# Patient Record
Sex: Female | Born: 1995 | Race: White | Hispanic: No | Marital: Single
Health system: Midwestern US, Academic
[De-identification: ages and names within clinical notes are randomized; demographics above are authoritative.]

---

## 2017-09-17 IMAGING — US PELCM
1 series · 14 of 25 positions shown · non-contrast
Comparison: none

[Series 1: us pelvic complete · 14 of 48 slices shown]
[im 1/48]
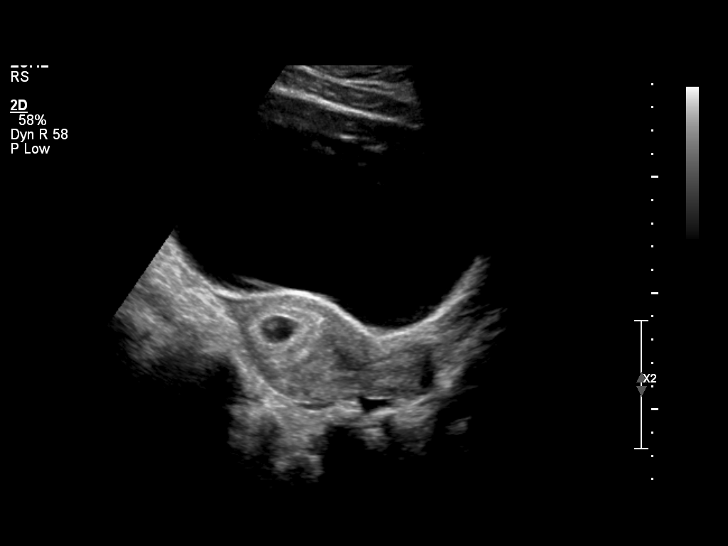
[im 4/48]
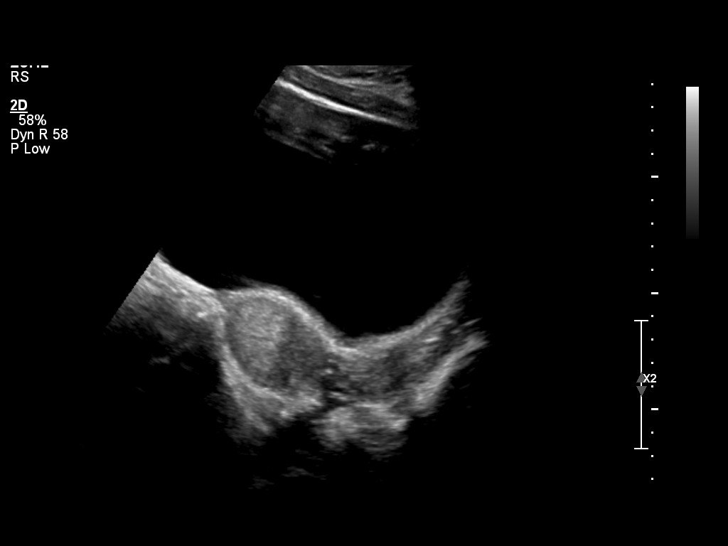
[im 8/48]
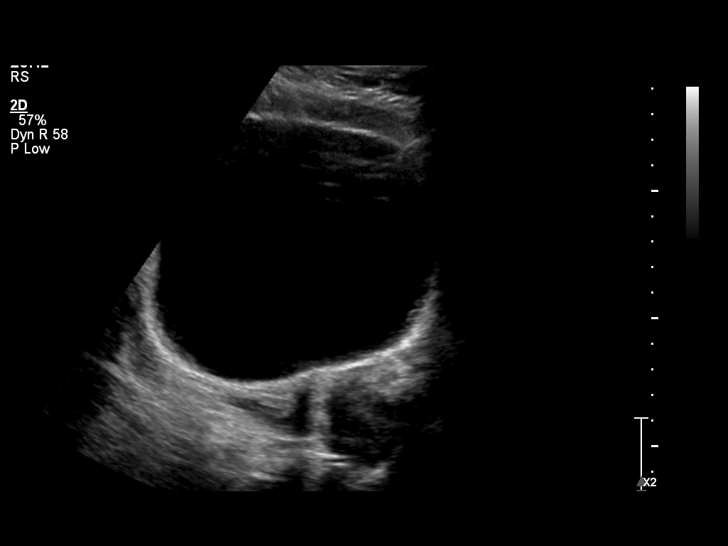
[im 12/48]
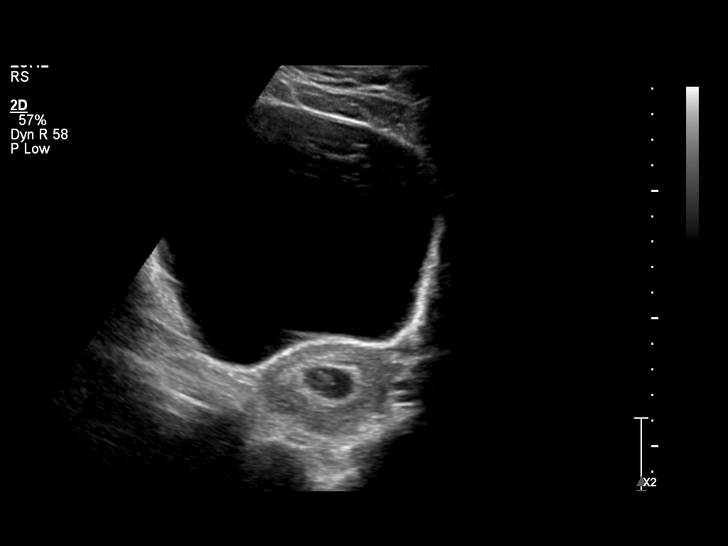
[im 16/48]
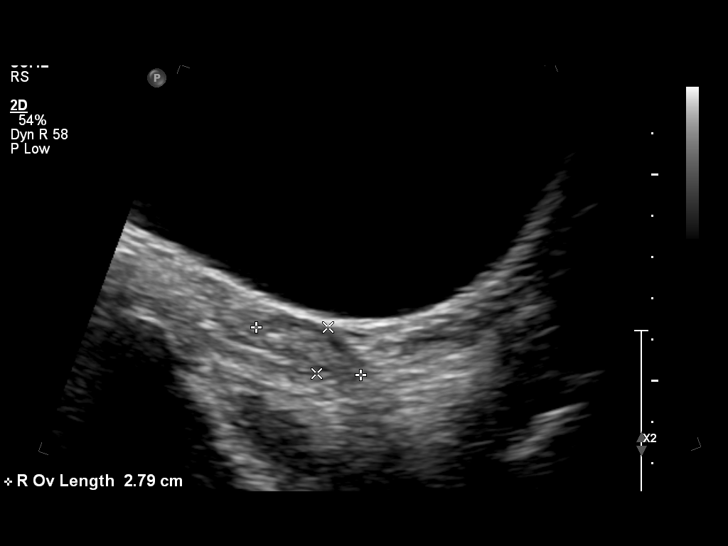
[im 18/48]
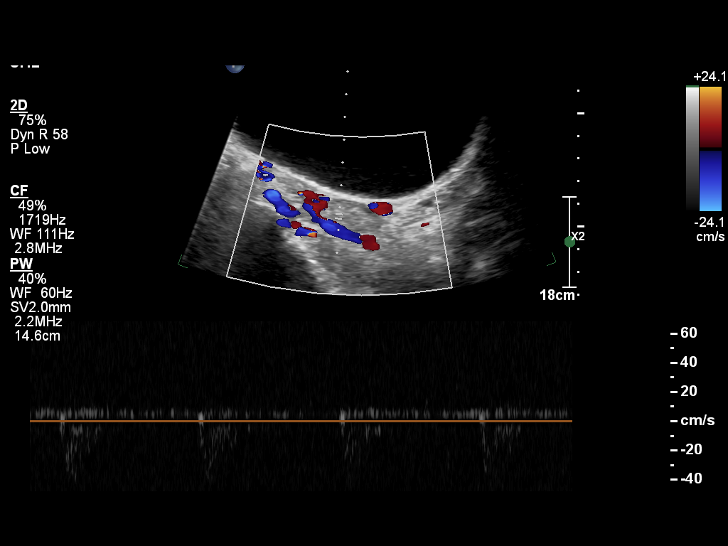
[im 22/48]
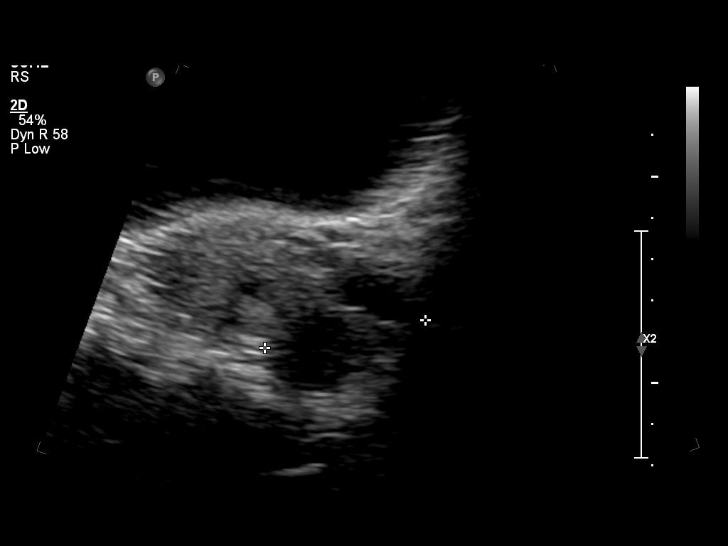
[im 26/48]
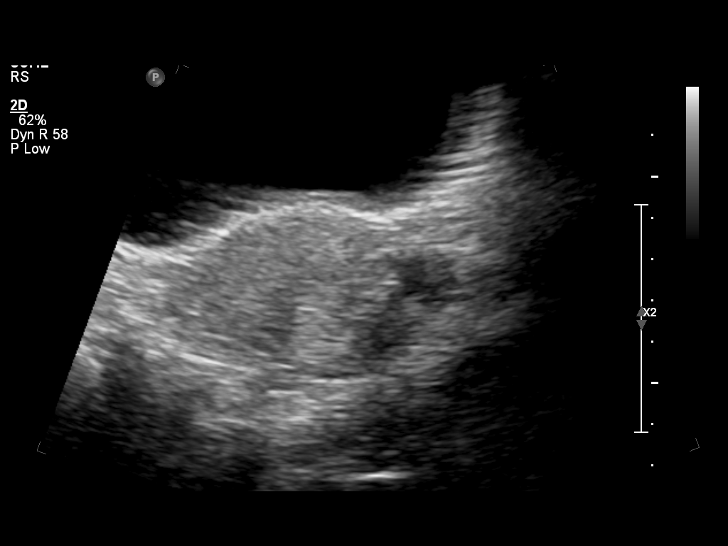
[im 30/48]
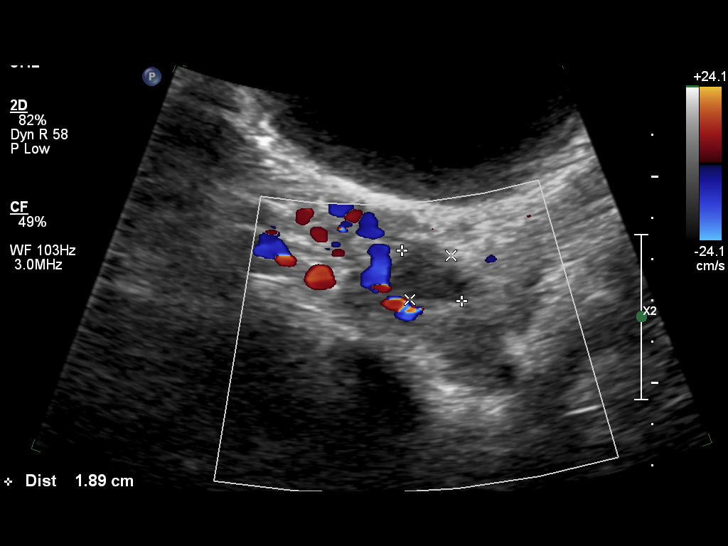
[im 32/48]
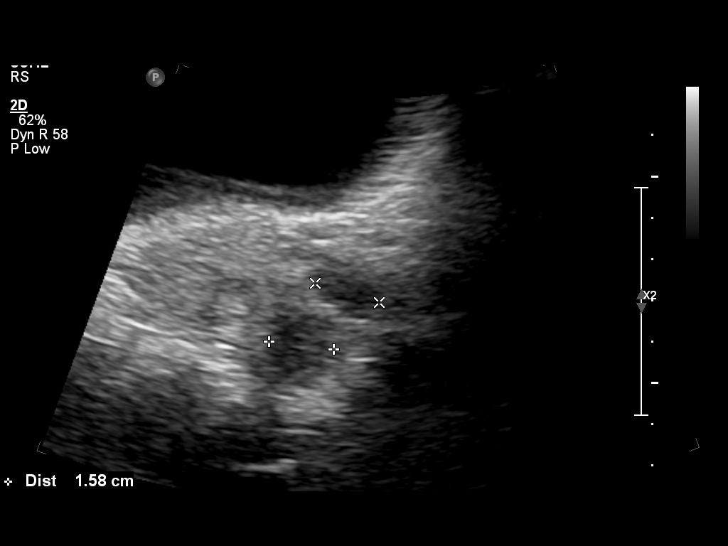
[im 36/48]
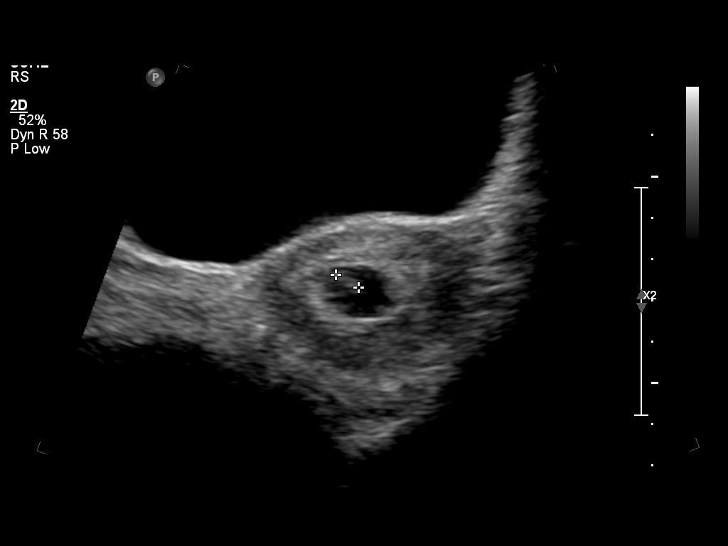
[im 40/48]
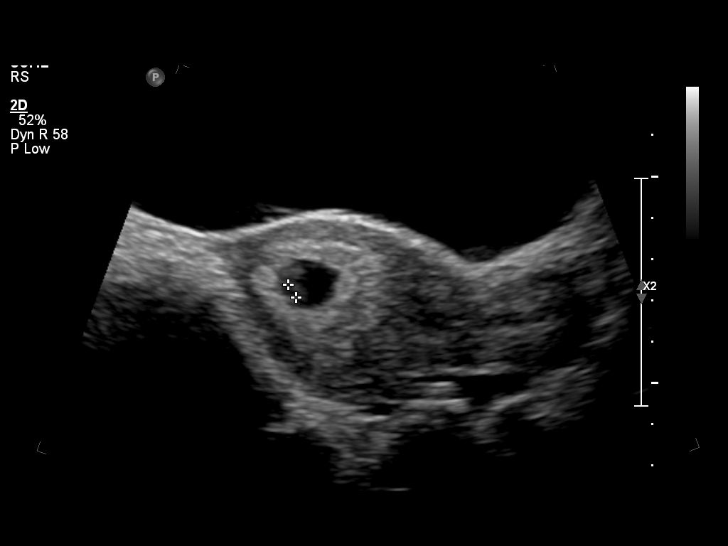
[im 44/48]
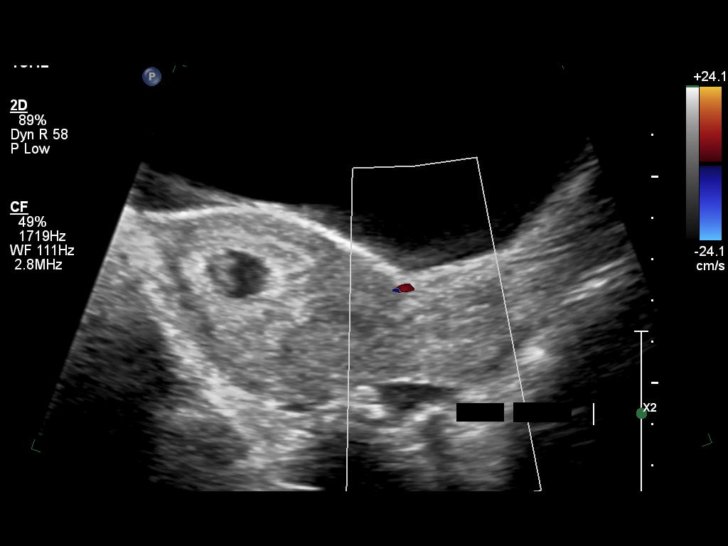
[im 48/48]
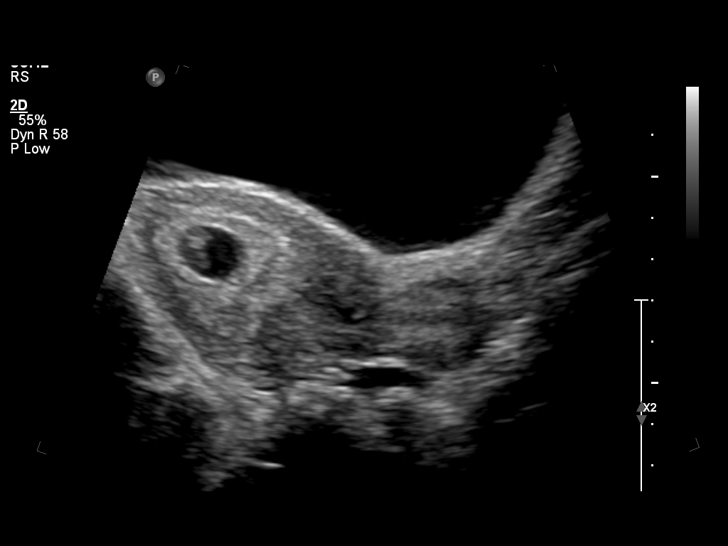

[14 of 25 positions shown; findings below may reference images not displayed]

EXAM
First trimester OB ultrasound.

INDICATION
BLEEDING

TECHNIQUE
Transabdominal technique only.

COMPARISONS
No prior studies are available for comparison.

FINDINGS
There is a single live intrauterine pregnancy. Fetal pole is identified with crown-rump length
measuring 5.4 millimeters correlating with gestational age is 6 weeks 3 days. There is no subchorion
ic hemorrhage identified. Fetal heart motion is documented at 128 beats per minute. A yolk sac is
identified.
The maternal ovaries are unremarkable. Hypoechoic areas in the left ovary measure 1.9 x 1.5 x
centimeters and 1.5 x 1.3 x 1.6 centimeters. Normal blood flow in both ovaries. Small amount of
posterior cul-de-sac fluid is incidentally noted.

IMPRESSION
Single live intrauterine pregnancy with estimated gestational age by ultrasound of 6 weeks 3 days
correlating with sonographic EDD of 05/10/2018.

Tech Notes:

## 2017-10-10 ENCOUNTER — Encounter: Admit: 2017-10-10 | Discharge: 2017-10-10

## 2017-10-10 ENCOUNTER — Inpatient Hospital Stay
Admit: 2017-10-10 | Discharge: 2017-10-17 | Disposition: A | Discharge status: Home / Self Care | Payer: 59 | Attending: Maternal & Fetal Medicine | Admitting: Maternal & Fetal Medicine

## 2017-10-10 DIAGNOSIS — E079 Disorder of thyroid, unspecified: ICD-10-CM

## 2017-10-10 DIAGNOSIS — E119 Type 2 diabetes mellitus without complications: Principal | ICD-10-CM

## 2017-10-10 LAB — POC GLUCOSE: Lab: 361 mg/dL — ABNORMAL HIGH (ref 70–100)

## 2017-10-10 MED ORDER — ONDANSETRON HCL (PF) 4 MG/2 ML IJ SOLN
4 mg | INTRAVENOUS | 0 refills | Status: DC | PRN
Start: 2017-10-10 — End: 2017-10-17

## 2017-10-10 MED ORDER — SODIUM CHLORIDE 0.9 % IV SOLP
1000 mL | INTRAVENOUS | 0 refills | Status: AC
Start: 2017-10-10 — End: ?

## 2017-10-10 MED ORDER — LEVOTHYROXINE 50 MCG PO TAB
50 ug | Freq: Every day | ORAL | 0 refills | Status: DC
Start: 2017-10-10 — End: 2017-10-17
  Administered 2017-10-11 – 2017-10-16 (×6): 50 ug via ORAL

## 2017-10-10 MED ORDER — INSULIN 100UNITS NS 100ML
1-32 [IU]/h | INTRAVENOUS | 0 refills | Status: DC
Start: 2017-10-10 — End: 2017-10-12
  Administered 2017-10-11 (×2): 1 [IU]/h via INTRAVENOUS

## 2017-10-10 MED ORDER — PRENATAL VIT-IRON FUM-FOLIC AC 28 MG IRON- 800 MCG PO TAB
1 | Freq: Every day | ORAL | 0 refills | Status: DC
Start: 2017-10-10 — End: 2017-10-17
  Administered 2017-10-11 – 2017-10-16 (×6): 1 via ORAL

## 2017-10-10 MED ORDER — NITROFURANTOIN MONOHYD/M-CRYST 100 MG PO CAP
100 mg | Freq: Two times a day (BID) | ORAL | 0 refills | Status: DC
Start: 2017-10-10 — End: 2017-10-17
  Administered 2017-10-11 – 2017-10-17 (×13): 100 mg via ORAL

## 2017-10-10 MED ORDER — INSULIN REGULAR HUMAN(#) 1 UNIT/ML IJ SYRINGE
1-20 [IU] | INTRAVENOUS | 0 refills | Status: DC
Start: 2017-10-10 — End: 2017-10-14
  Administered 2017-10-11: 3 [IU] via INTRAVENOUS

## 2017-10-11 LAB — POC GLUCOSE
Lab: 254 mg/dL — ABNORMAL HIGH (ref 70–100)
Lab: 114 mg/dL — ABNORMAL HIGH (ref 70–100)
Lab: 142 mg/dL — ABNORMAL HIGH (ref 70–100)
Lab: 154 mg/dL — ABNORMAL HIGH (ref 70–100)
Lab: 114 mg/dL — ABNORMAL HIGH (ref 70–100)
Lab: 103 mg/dL — ABNORMAL HIGH (ref 70–100)
Lab: 79 mg/dL (ref 70–100)
Lab: 118 mg/dL — ABNORMAL HIGH (ref 70–100)
Lab: 133 mg/dL — ABNORMAL HIGH (ref 70–100)
Lab: 142 mg/dL — ABNORMAL HIGH (ref 70–100)
Lab: 156 mg/dL — ABNORMAL HIGH (ref 70–100)
Lab: 179 mg/dL — ABNORMAL HIGH (ref 70–100)
Lab: 156 mg/dL — ABNORMAL HIGH (ref 70–100)
Lab: 154 mg/dL — ABNORMAL HIGH (ref 70–100)
Lab: 131 mg/dL — ABNORMAL HIGH (ref 70–100)
Lab: 64 mg/dL — ABNORMAL LOW (ref 70–100)

## 2017-10-11 LAB — COMPREHENSIVE METABOLIC PANEL
Lab: 0.6 mg/dL (ref 0.4–1.00)
Lab: 131 MMOL/L — ABNORMAL LOW (ref 137–147)
Lab: 7.1 g/dL (ref 6.0–8.0)
Lab: 9.3 mg/dL (ref 8.5–10.6)

## 2017-10-11 LAB — URINALYSIS DIPSTICK
Lab: NEGATIVE mL/min
Lab: POSITIVE — AB

## 2017-10-11 LAB — BETA HYDROXYBUTYRATE (KETONES): Lab: 0.1 MMOL/L (ref <0.3)

## 2017-10-11 LAB — CBC: Lab: 16 10*3/uL — ABNORMAL HIGH (ref 4.5–11.0)

## 2017-10-11 LAB — RUBELLA AB IGG

## 2017-10-11 LAB — SYPHILIS AB SCREEN: Lab: NEGATIVE MMOL/L (ref 3.5–5.1)

## 2017-10-11 LAB — HEMOGLOBIN A1C: Lab: 10 % — ABNORMAL HIGH (ref 4.0–6.0)

## 2017-10-11 LAB — THYROID STIMULATING HORMONE-TSH: Lab: 2.1 uU/mL — ABNORMAL HIGH (ref 0.35–5.00)

## 2017-10-11 LAB — HEPATITIS B SURFACE AG: Lab: NEGATIVE mg/dL — ABNORMAL HIGH (ref 70–100)

## 2017-10-11 LAB — HIV 1& 2 AG-AB SCRN W REFLEX HIV 1 PCR QUANT: Lab: NEGATIVE mg/dL (ref 7–25)

## 2017-10-11 LAB — FREE T4 (FREE THYROXINE) ONLY: Lab: 0.8 ng/dL — AB (ref 0.6–1.6)

## 2017-10-11 LAB — URINALYSIS, MICROSCOPIC

## 2017-10-11 MED ORDER — INSULIN ASPART 100 UNIT/ML SC FLEXPEN
12 [IU] | Freq: Every day | SUBCUTANEOUS | 0 refills | Status: DC
Start: 2017-10-11 — End: 2017-10-12

## 2017-10-11 MED ORDER — INSULIN ASPART 100 UNIT/ML SC FLEXPEN
10 [IU] | Freq: Every day | SUBCUTANEOUS | 0 refills | Status: DC
Start: 2017-10-11 — End: 2017-10-12
  Administered 2017-10-12: 02:00:00 10 [IU] via SUBCUTANEOUS

## 2017-10-11 MED ORDER — NPH INSULIN HUMAN RECOMB 100 UNIT/ML (3 ML) SC PEN
21 [IU] | Freq: Every day | SUBCUTANEOUS | 0 refills | Status: DC
Start: 2017-10-11 — End: 2017-10-13
  Administered 2017-10-11: 18:00:00 21 [IU] via SUBCUTANEOUS

## 2017-10-11 MED ORDER — INSULIN ASPART 100 UNIT/ML SC FLEXPEN
8 [IU] | Freq: Every day | SUBCUTANEOUS | 0 refills | Status: DC
Start: 2017-10-11 — End: 2017-10-12

## 2017-10-11 MED ORDER — NPH INSULIN HUMAN RECOMB 100 UNIT/ML (3 ML) SC PEN
8 [IU] | Freq: Every evening | SUBCUTANEOUS | 0 refills | Status: DC
Start: 2017-10-11 — End: 2017-10-13

## 2017-10-11 MED ADMIN — SODIUM CHLORIDE 0.9 % IV SOLP [27838]: 1000 mL | INTRAVENOUS | @ 01:00:00 | Stop: 2017-10-11 | NDC 00338004904

## 2017-10-12 LAB — TOTAL PROTEIN-URINE 24 HR
Lab: 160 mg/(24.h) — ABNORMAL HIGH (ref 50–150)
Lab: 5 mg/dL

## 2017-10-12 LAB — CULTURE-URINE W/SENSITIVITY: Lab: <10 U/L — AB (ref 7–56)

## 2017-10-12 LAB — POC GLUCOSE
Lab: 74 mg/dL (ref 70–100)
Lab: 128 mg/dL — ABNORMAL HIGH (ref 70–100)
Lab: 62 mg/dL — ABNORMAL LOW (ref 70–100)
Lab: 214 mg/dL — ABNORMAL HIGH (ref 70–100)
Lab: 162 mg/dL — ABNORMAL HIGH (ref 70–100)
Lab: 197 mg/dL — ABNORMAL HIGH (ref 70–100)
Lab: 204 mg/dL — ABNORMAL HIGH (ref 70–100)
Lab: 225 mg/dL — ABNORMAL HIGH (ref 70–100)
Lab: 156 mg/dL — ABNORMAL HIGH (ref 70–100)
Lab: 184 mg/dL — ABNORMAL HIGH (ref 70–100)
Lab: 242 mg/dL — ABNORMAL HIGH (ref 70–100)
Lab: 164 mg/dL — ABNORMAL HIGH (ref 70–100)

## 2017-10-12 LAB — CREATININE CLEARANCE-URINE 24H: Lab: 48 mg/dL

## 2017-10-12 LAB — URINE COLLECTION: Lab: 24

## 2017-10-12 MED ORDER — INSULIN ASPART 100 UNIT/ML SC FLEXPEN
11 [IU] | Freq: Every day | SUBCUTANEOUS | 0 refills | Status: DC
Start: 2017-10-12 — End: 2017-10-13

## 2017-10-12 MED ORDER — INSULIN ASPART 100 UNIT/ML SC FLEXPEN
11 [IU] | Freq: Every day | SUBCUTANEOUS | 0 refills | Status: DC
Start: 2017-10-12 — End: 2017-10-13
  Administered 2017-10-13: 02:00:00 11 [IU] via SUBCUTANEOUS

## 2017-10-12 MED ORDER — INSULIN REGULAR HUMAN(#) 1 UNIT/ML IJ SYRINGE
4 [IU] | Freq: Once | INTRAVENOUS | 0 refills | Status: CP
Start: 2017-10-12 — End: ?
  Administered 2017-10-12: 10:00:00 4 [IU] via INTRAVENOUS

## 2017-10-12 MED ORDER — INSULIN REGULAR HUMAN(#) 1 UNIT/ML IJ SYRINGE
6 [IU] | Freq: Once | INTRAVENOUS | 0 refills | Status: CP
Start: 2017-10-12 — End: ?
  Administered 2017-10-12: 13:00:00 6 [IU] via INTRAVENOUS

## 2017-10-12 MED ORDER — INSULIN ASPART 100 UNIT/ML SC FLEXPEN
10 [IU] | Freq: Every day | SUBCUTANEOUS | 0 refills | Status: DC
Start: 2017-10-12 — End: 2017-10-13

## 2017-10-12 MED ORDER — INSULIN REGULAR HUMAN(#) 1 UNIT/ML IJ SYRINGE
5 [IU] | Freq: Once | INTRAVENOUS | 0 refills | Status: CP
Start: 2017-10-12 — End: ?
  Administered 2017-10-12: 13:00:00 5 [IU] via INTRAVENOUS

## 2017-10-13 LAB — POC GLUCOSE
Lab: 45 mg/dL — CL (ref 70–100)
Lab: 67 mg/dL — ABNORMAL LOW (ref 70–100)
Lab: 91 mg/dL (ref 70–100)
Lab: 73 mg/dL (ref 70–100)
Lab: 174 mg/dL — ABNORMAL HIGH (ref 70–100)
Lab: 187 mg/dL — ABNORMAL HIGH (ref 70–100)
Lab: 222 mg/dL — ABNORMAL HIGH (ref 70–100)
Lab: 219 mg/dL — ABNORMAL HIGH (ref 70–100)
Lab: 132 mg/dL — ABNORMAL HIGH (ref 70–100)
Lab: 57 mg/dL — ABNORMAL LOW (ref 70–100)
Lab: 76 mg/dL (ref 70–100)
Lab: 82 mg/dL (ref 70–100)

## 2017-10-13 MED ORDER — INSULIN ASPART 100 UNIT/ML SC FLEXPEN
12 [IU] | Freq: Every day | SUBCUTANEOUS | 0 refills | Status: DC
Start: 2017-10-13 — End: 2017-10-13

## 2017-10-13 MED ORDER — NPH INSULIN HUMAN RECOMB 100 UNIT/ML (3 ML) SC PEN
16 [IU] | Freq: Every evening | SUBCUTANEOUS | 0 refills | Status: DC
Start: 2017-10-13 — End: 2017-10-13

## 2017-10-13 MED ORDER — INSULIN ASPART 100 UNIT/ML SC FLEXPEN
12 [IU] | Freq: Every day | SUBCUTANEOUS | 0 refills | Status: DC
Start: 2017-10-13 — End: 2017-10-17

## 2017-10-13 MED ORDER — INSULIN REGULAR HUMAN(#) 1 UNIT/ML IJ SYRINGE
10 [IU] | Freq: Once | INTRAVENOUS | 0 refills | Status: CP
Start: 2017-10-13 — End: ?
  Administered 2017-10-13: 22:00:00 10 [IU] via INTRAVENOUS

## 2017-10-13 MED ORDER — NPH INSULIN HUMAN RECOMB 100 UNIT/ML (3 ML) SC PEN
32 [IU] | Freq: Every day | SUBCUTANEOUS | 0 refills | Status: DC
Start: 2017-10-13 — End: 2017-10-15

## 2017-10-13 MED ORDER — NPH INSULIN HUMAN RECOMB 100 UNIT/ML (3 ML) SC PEN
32 [IU] | SUBCUTANEOUS | 0 refills | Status: DC
Start: 2017-10-13 — End: 2017-10-14

## 2017-10-13 MED ORDER — INSULIN ASPART 100 UNIT/ML SC FLEXPEN
10 [IU] | Freq: Every day | SUBCUTANEOUS | 0 refills | Status: DC
Start: 2017-10-13 — End: 2017-10-14

## 2017-10-13 MED ORDER — NPH INSULIN HUMAN RECOMB 100 UNIT/ML (3 ML) SC PEN
20 [IU] | SUBCUTANEOUS | 0 refills | Status: DC
Start: 2017-10-13 — End: 2017-10-14

## 2017-10-13 MED ORDER — NPH INSULIN HUMAN RECOMB 100 UNIT/ML (3 ML) SC PEN
20 [IU] | Freq: Every day | SUBCUTANEOUS | 0 refills | Status: DC
Start: 2017-10-13 — End: 2017-10-14

## 2017-10-13 MED ORDER — INSULIN ASPART 100 UNIT/ML SC FLEXPEN
12 [IU] | Freq: Every day | SUBCUTANEOUS | 0 refills | Status: DC
Start: 2017-10-13 — End: 2017-10-14

## 2017-10-13 MED ORDER — ACETAMINOPHEN 500 MG PO TAB
1000 mg | Freq: Once | ORAL | 0 refills | Status: CP
Start: 2017-10-13 — End: ?
  Administered 2017-10-13: 08:00:00 1000 mg via ORAL

## 2017-10-13 MED ORDER — INSULIN REGULAR HUMAN(#) 1 UNIT/ML IJ SYRINGE
6 [IU] | Freq: Once | INTRAVENOUS | 0 refills | Status: CP
Start: 2017-10-13 — End: ?
  Administered 2017-10-13: 18:00:00 6 [IU] via INTRAVENOUS

## 2017-10-14 ENCOUNTER — Encounter: Admit: 2017-10-14 | Discharge: 2017-10-14

## 2017-10-14 LAB — POC GLUCOSE
Lab: 118 mg/dL — ABNORMAL HIGH (ref 70–100)
Lab: 129 mg/dL — ABNORMAL HIGH (ref 70–100)
Lab: 131 mg/dL — ABNORMAL HIGH (ref 70–100)
Lab: 134 mg/dL — ABNORMAL HIGH (ref 70–100)
Lab: 136 mg/dL — ABNORMAL HIGH (ref 70–100)
Lab: 172 mg/dL — ABNORMAL HIGH (ref 70–100)
Lab: 198 mg/dL — ABNORMAL HIGH (ref 70–100)
Lab: 269 mg/dL — ABNORMAL HIGH (ref 70–100)
Lab: 49 mg/dL — CL (ref 70–100)
Lab: 53 mg/dL — ABNORMAL LOW (ref 70–100)

## 2017-10-14 MED ORDER — INSULIN REGULAR HUMAN(#) 1 UNIT/ML IJ SYRINGE
5 [IU] | Freq: Once | INTRAVENOUS | 0 refills | Status: CP
Start: 2017-10-14 — End: ?
  Administered 2017-10-14: 17:00:00 5 [IU] via INTRAVENOUS

## 2017-10-14 MED ORDER — INSULIN REGULAR HUMAN 100 UNIT/ML IJ SOLN
5 [IU] | SUBCUTANEOUS | 0 refills | Status: DC
Start: 2017-10-14 — End: 2017-10-14

## 2017-10-14 MED ORDER — INSULIN ASPART 100 UNIT/ML SC FLEXPEN
14 [IU] | Freq: Every day | SUBCUTANEOUS | 0 refills | Status: DC
Start: 2017-10-14 — End: 2017-10-16

## 2017-10-14 MED ORDER — NPH INSULIN HUMAN RECOMB 100 UNIT/ML (3 ML) SC PEN
28 [IU] | Freq: Every day | SUBCUTANEOUS | 0 refills | Status: DC
Start: 2017-10-14 — End: 2017-10-15

## 2017-10-14 MED ORDER — INSULIN REGULAR HUMAN(#) 1 UNIT/ML IJ SYRINGE
5 [IU] | Freq: Once | INTRAVENOUS | 0 refills | Status: CP
Start: 2017-10-14 — End: ?
  Administered 2017-10-14: 23:00:00 5 [IU] via INTRAVENOUS

## 2017-10-15 LAB — POC GLUCOSE
Lab: 61 mg/dL — ABNORMAL LOW (ref 70–100)
Lab: 69 mg/dL — ABNORMAL LOW (ref 70–100)
Lab: 67 mg/dL — ABNORMAL LOW (ref 70–100)
Lab: 51 mg/dL — ABNORMAL LOW (ref 70–100)
Lab: 81 mg/dL (ref 70–100)
Lab: 102 mg/dL — ABNORMAL HIGH (ref 70–100)
Lab: 207 mg/dL — ABNORMAL HIGH (ref 70–100)
Lab: 64 mg/dL — ABNORMAL LOW (ref 70–100)
Lab: 167 mg/dL — ABNORMAL HIGH (ref 70–100)
Lab: 184 mg/dL — ABNORMAL HIGH (ref 70–100)

## 2017-10-15 MED ORDER — NPH INSULIN HUMAN RECOMB 100 UNIT/ML (3 ML) SC PEN
30 [IU] | Freq: Every day | SUBCUTANEOUS | 0 refills | Status: DC
Start: 2017-10-15 — End: 2017-10-16

## 2017-10-15 MED ORDER — INSULIN REGULAR HUMAN(#) 1 UNIT/ML IJ SYRINGE
6 [IU] | Freq: Once | INTRAVENOUS | 0 refills | Status: CP
Start: 2017-10-15 — End: ?
  Administered 2017-10-15: 10:00:00 6 [IU] via INTRAVENOUS

## 2017-10-15 MED ORDER — NPH INSULIN HUMAN RECOMB 100 UNIT/ML (3 ML) SC PEN
28 [IU] | Freq: Every day | SUBCUTANEOUS | 0 refills | Status: DC
Start: 2017-10-15 — End: 2017-10-16

## 2017-10-15 MED ORDER — INSULIN REGULAR HUMAN(#) 1 UNIT/ML IJ SYRINGE
1-20 [IU] | INTRAVENOUS | 0 refills | Status: DC
Start: 2017-10-15 — End: 2017-10-17

## 2017-10-16 DIAGNOSIS — Z3490 Encounter for supervision of normal pregnancy, unspecified, unspecified trimester: Secondary | ICD-10-CM

## 2017-10-16 LAB — POC GLUCOSE
Lab: 51 mg/dL — ABNORMAL LOW (ref 70–100)
Lab: 56 mg/dL — ABNORMAL LOW (ref 70–100)
Lab: 89 mg/dL (ref 70–100)
Lab: 131 mg/dL — ABNORMAL HIGH (ref 70–100)
Lab: 97 mg/dL (ref 70–100)
Lab: 111 mg/dL — ABNORMAL HIGH (ref 70–100)
Lab: 71 mg/dL (ref 70–100)
Lab: 77 mg/dL (ref 70–100)
Lab: 40 mg/dL — CL (ref 70–100)
Lab: 49 mg/dL — CL (ref 70–100)
Lab: 78 mg/dL (ref 70–100)

## 2017-10-16 MED ORDER — NPH INSULIN HUMAN RECOMB 100 UNIT/ML (3 ML) SC PEN
28 [IU] | Freq: Every day | SUBCUTANEOUS | 0 refills | Status: DC
Start: 2017-10-16 — End: 2017-10-17

## 2017-10-16 MED ORDER — NPH INSULIN HUMAN RECOMB 100 UNIT/ML (3 ML) SC PEN
24 [IU] | Freq: Every day | SUBCUTANEOUS | 0 refills | Status: DC
Start: 2017-10-16 — End: 2017-10-17

## 2017-10-16 MED ORDER — LEVOTHYROXINE 50 MCG PO TAB
50 ug | ORAL_TABLET | Freq: Every day | ORAL | 3 refills | 30.00000 days | Status: AC
Start: 2017-10-16 — End: 2017-11-11

## 2017-10-16 MED ORDER — NITROFURANTOIN MONOHYD/M-CRYST 100 MG PO CAP
100 mg | ORAL_CAPSULE | Freq: Two times a day (BID) | ORAL | 0 refills | 7.00000 days | Status: AC
Start: 2017-10-16 — End: ?

## 2017-10-16 MED ORDER — INSULIN ASPART 100 UNIT/ML SC FLEXPEN
3 refills | 42.00000 days | Status: AC
Start: 2017-10-16 — End: 2017-11-25

## 2017-10-16 MED ORDER — INSULIN ASPART 100 UNIT/ML SC FLEXPEN
18 [IU] | Freq: Every day | SUBCUTANEOUS | 0 refills | Status: DC
Start: 2017-10-16 — End: 2017-10-17

## 2017-10-16 MED ORDER — NPH INSULIN HUMAN RECOMB 100 UNIT/ML (3 ML) SC PEN
Freq: Every evening | 3 refills | 32.00000 days | Status: AC
Start: 2017-10-16 — End: 2017-10-24

## 2017-10-17 ENCOUNTER — Encounter: Admit: 2017-10-17 | Discharge: 2017-10-17

## 2017-10-17 DIAGNOSIS — E039 Hypothyroidism, unspecified: ICD-10-CM

## 2017-10-17 DIAGNOSIS — O24011 Pre-existing diabetes mellitus, type 1, in pregnancy, first trimester: Principal | ICD-10-CM

## 2017-10-17 DIAGNOSIS — Z79890 Hormone replacement therapy (postmenopausal): ICD-10-CM

## 2017-10-17 DIAGNOSIS — Z3A11 11 weeks gestation of pregnancy: ICD-10-CM

## 2017-10-17 DIAGNOSIS — O99281 Endocrine, nutritional and metabolic diseases complicating pregnancy, first trimester: ICD-10-CM

## 2017-10-17 MED ORDER — PEN NEEDLE, DIABETIC 32 GAUGE X 5/32" MISC NDLE
1 | 3 refills | 30.00000 days | Status: AC | PRN
Start: 2017-10-17 — End: ?

## 2017-10-22 ENCOUNTER — Encounter: Admit: 2017-10-22 | Discharge: 2017-10-22

## 2017-10-22 DIAGNOSIS — Z3A10 10 weeks gestation of pregnancy: Principal | ICD-10-CM

## 2017-10-23 ENCOUNTER — Encounter: Admit: 2017-10-23 | Discharge: 2017-10-23

## 2017-10-24 ENCOUNTER — Encounter: Admit: 2017-10-24 | Discharge: 2017-10-24

## 2017-10-24 DIAGNOSIS — Z3A10 10 weeks gestation of pregnancy: Principal | ICD-10-CM

## 2017-10-24 DIAGNOSIS — O24011 Pre-existing diabetes mellitus, type 1, in pregnancy, first trimester: ICD-10-CM

## 2017-10-24 MED ORDER — INSULIN NPH ISOPH U-100 HUMAN 100 UNIT/ML SC SUSP
3 refills | 32.00000 days | Status: AC
Start: 2017-10-24 — End: 2018-01-08

## 2017-10-28 ENCOUNTER — Ambulatory Visit: Admit: 2017-10-28 | Discharge: 2017-10-29 | Payer: 59

## 2017-10-28 ENCOUNTER — Encounter: Admit: 2017-10-28 | Discharge: 2017-10-28

## 2017-10-28 DIAGNOSIS — Z3682 Encounter for antenatal screening for nuchal translucency: Principal | ICD-10-CM

## 2017-10-28 DIAGNOSIS — O24011 Pre-existing diabetes mellitus, type 1, in pregnancy, first trimester: Principal | ICD-10-CM

## 2017-10-28 DIAGNOSIS — O99281 Endocrine, nutritional and metabolic diseases complicating pregnancy, first trimester: ICD-10-CM

## 2017-11-07 ENCOUNTER — Encounter: Admit: 2017-11-07 | Discharge: 2017-11-07

## 2017-11-11 ENCOUNTER — Encounter: Admit: 2017-11-11 | Discharge: 2017-11-11

## 2017-11-11 ENCOUNTER — Ambulatory Visit: Admit: 2017-11-11 | Discharge: 2017-11-12 | Payer: 59

## 2017-11-11 DIAGNOSIS — Z3A10 10 weeks gestation of pregnancy: Principal | ICD-10-CM

## 2017-11-11 DIAGNOSIS — O24011 Pre-existing diabetes mellitus, type 1, in pregnancy, first trimester: ICD-10-CM

## 2017-11-11 DIAGNOSIS — E1069 Type 1 diabetes mellitus with other specified complication: ICD-10-CM

## 2017-11-11 DIAGNOSIS — E119 Type 2 diabetes mellitus without complications: Principal | ICD-10-CM

## 2017-11-11 DIAGNOSIS — Z3A14 14 weeks gestation of pregnancy: ICD-10-CM

## 2017-11-11 DIAGNOSIS — E038 Other specified hypothyroidism: ICD-10-CM

## 2017-11-11 DIAGNOSIS — E079 Disorder of thyroid, unspecified: ICD-10-CM

## 2017-11-11 MED ORDER — INSULIN SYRINGE-NEEDLE U-100 0.5 ML 31 GAUGE X 5/16" MISC SYRG
3 refills | Status: AC
Start: 2017-11-11 — End: ?

## 2017-11-11 MED ORDER — LEVOTHYROXINE 50 MCG PO TAB
50 ug | ORAL_TABLET | Freq: Every day | ORAL | 3 refills | 30.00000 days | Status: AC
Start: 2017-11-11 — End: ?

## 2017-11-11 MED ORDER — NPH INSULIN HUMAN RECOMB 100 UNIT/ML (3 ML) SC PEN
3 refills | Status: SS
Start: 2017-11-11 — End: 2018-01-05

## 2017-11-12 ENCOUNTER — Encounter: Admit: 2017-11-12 | Discharge: 2017-11-12

## 2017-11-25 ENCOUNTER — Encounter: Admit: 2017-11-25 | Discharge: 2017-11-25

## 2017-11-25 ENCOUNTER — Ambulatory Visit: Admit: 2017-11-25 | Discharge: 2017-11-26 | Payer: 59

## 2017-11-25 DIAGNOSIS — Z3A14 14 weeks gestation of pregnancy: Principal | ICD-10-CM

## 2017-11-25 DIAGNOSIS — E079 Disorder of thyroid, unspecified: ICD-10-CM

## 2017-11-25 DIAGNOSIS — E119 Type 2 diabetes mellitus without complications: Principal | ICD-10-CM

## 2017-11-25 DIAGNOSIS — E038 Other specified hypothyroidism: ICD-10-CM

## 2017-11-25 DIAGNOSIS — O24011 Pre-existing diabetes mellitus, type 1, in pregnancy, first trimester: Principal | ICD-10-CM

## 2017-11-25 MED ORDER — INSULIN ASPART 100 UNIT/ML SC FLEXPEN
3 refills | Status: SS
Start: 2017-11-25 — End: 2018-01-06

## 2017-11-27 ENCOUNTER — Encounter: Admit: 2017-11-27 | Discharge: 2017-11-27

## 2017-12-09 ENCOUNTER — Encounter: Admit: 2017-12-09 | Discharge: 2017-12-09

## 2017-12-09 ENCOUNTER — Ambulatory Visit: Admit: 2017-12-09 | Discharge: 2017-12-10 | Payer: 59

## 2017-12-09 DIAGNOSIS — Z363 Encounter for antenatal screening for malformations: Principal | ICD-10-CM

## 2017-12-09 DIAGNOSIS — O24012 Pre-existing diabetes mellitus, type 1, in pregnancy, second trimester: Principal | ICD-10-CM

## 2017-12-09 DIAGNOSIS — E119 Type 2 diabetes mellitus without complications: Principal | ICD-10-CM

## 2017-12-09 DIAGNOSIS — O99282 Endocrine, nutritional and metabolic diseases complicating pregnancy, second trimester: ICD-10-CM

## 2017-12-09 DIAGNOSIS — Z3686 Encounter for antenatal screening for cervical length: ICD-10-CM

## 2017-12-09 DIAGNOSIS — E079 Disorder of thyroid, unspecified: ICD-10-CM

## 2017-12-09 DIAGNOSIS — O0992 Supervision of high risk pregnancy, unspecified, second trimester: ICD-10-CM

## 2017-12-17 ENCOUNTER — Encounter: Admit: 2017-12-17 | Discharge: 2017-12-17

## 2017-12-19 ENCOUNTER — Encounter: Admit: 2017-12-19 | Discharge: 2017-12-19

## 2017-12-24 ENCOUNTER — Encounter: Admit: 2017-12-24 | Discharge: 2017-12-24

## 2017-12-25 ENCOUNTER — Encounter: Admit: 2017-12-25 | Discharge: 2017-12-25

## 2018-01-01 ENCOUNTER — Encounter: Admit: 2018-01-01 | Discharge: 2018-01-01

## 2018-01-05 ENCOUNTER — Encounter: Admit: 2018-01-05 | Discharge: 2018-01-05

## 2018-01-05 DIAGNOSIS — E119 Type 2 diabetes mellitus without complications: Principal | ICD-10-CM

## 2018-01-05 DIAGNOSIS — E079 Disorder of thyroid, unspecified: ICD-10-CM

## 2018-01-05 LAB — POC GLUCOSE
Lab: 108 mg/dL — ABNORMAL HIGH (ref 70–100)
Lab: 161 mg/dL — ABNORMAL HIGH (ref 70–100)
Lab: 41 mg/dL — CL (ref 70–100)
Lab: 55 mg/dL — ABNORMAL LOW (ref 70–100)
Lab: 65 mg/dL — ABNORMAL LOW (ref 70–100)

## 2018-01-05 MED ORDER — INSULIN REGULAR HUMAN(#) 1 UNIT/ML IJ SYRINGE
1-20 [IU] | INTRAVENOUS | 0 refills | Status: DC
Start: 2018-01-05 — End: 2018-01-07

## 2018-01-05 MED ORDER — INSULIN ASPART 100 UNIT/ML SC FLEXPEN
20 [IU] | Freq: Once | SUBCUTANEOUS | 0 refills | Status: CP
Start: 2018-01-05 — End: ?
  Administered 2018-01-05: 19:00:00 20 [IU] via SUBCUTANEOUS

## 2018-01-05 MED ORDER — LEVOTHYROXINE 50 MCG PO TAB
50 ug | Freq: Every day | ORAL | 0 refills | Status: DC
Start: 2018-01-05 — End: 2018-01-07
  Administered 2018-01-06: 12:00:00 50 ug via ORAL

## 2018-01-05 MED ORDER — INSULIN REGULAR HUMAN(#) 1 UNIT/ML IJ SYRINGE
10 [IU] | Freq: Once | INTRAVENOUS | 0 refills | Status: CP
Start: 2018-01-05 — End: ?
  Administered 2018-01-05: 18:00:00 10 [IU] via INTRAVENOUS

## 2018-01-05 MED ORDER — INSULIN PUMP - LISPRO
Freq: Three times a day (TID) | SUBCUTANEOUS | 0 refills | Status: DC | PRN
Start: 2018-01-05 — End: 2018-01-06

## 2018-01-05 MED ORDER — INSULIN ASPART 100 UNIT/ML SC FLEXPEN
1-80 [IU] | SUBCUTANEOUS | 0 refills | Status: DC
Start: 2018-01-05 — End: 2018-01-07

## 2018-01-06 ENCOUNTER — Observation Stay: Admit: 2018-01-05 | Discharge: 2018-01-06 | Payer: 59

## 2018-01-06 ENCOUNTER — Encounter: Admit: 2018-01-06 | Discharge: 2018-01-06

## 2018-01-06 DIAGNOSIS — Z3A22 22 weeks gestation of pregnancy: ICD-10-CM

## 2018-01-06 DIAGNOSIS — E10649 Type 1 diabetes mellitus with hypoglycemia without coma: ICD-10-CM

## 2018-01-06 DIAGNOSIS — E039 Hypothyroidism, unspecified: ICD-10-CM

## 2018-01-06 DIAGNOSIS — O24012 Pre-existing diabetes mellitus, type 1, in pregnancy, second trimester: Principal | ICD-10-CM

## 2018-01-06 DIAGNOSIS — Z0489 Encounter for examination and observation for other specified reasons: ICD-10-CM

## 2018-01-06 DIAGNOSIS — O99282 Endocrine, nutritional and metabolic diseases complicating pregnancy, second trimester: ICD-10-CM

## 2018-01-06 LAB — POC GLUCOSE
Lab: 101 mg/dL — ABNORMAL HIGH (ref 70–100)
Lab: 114 mg/dL — ABNORMAL HIGH (ref >60)
Lab: 163 mg/dL — ABNORMAL HIGH (ref 70–100)
Lab: 186 mg/dL — ABNORMAL HIGH (ref 70–100)
Lab: 189 mg/dL — ABNORMAL HIGH (ref 70–100)
Lab: 60 mg/dL — ABNORMAL LOW (ref 70–100)
Lab: 79 mg/dL (ref 70–100)
Lab: 84 mg/dL (ref 70–100)
Lab: 89 mg/dL (ref 70–100)

## 2018-01-06 LAB — FREE T4 (FREE THYROXINE) ONLY: Lab: 0.7 ng/dL (ref 0.6–1.6)

## 2018-01-06 LAB — THYROID STIMULATING HORMONE-TSH: Lab: 0.8 uU/mL (ref 0.35–5.00)

## 2018-01-06 MED ORDER — INSULIN ASPART 100 UNIT/ML SC FLEXPEN
3 refills | 42.00000 days | Status: AC
Start: 2018-01-06 — End: 2018-01-08

## 2018-01-06 MED ORDER — INSULIN PUMP - LISPRO
Freq: Three times a day (TID) | SUBCUTANEOUS | 0 refills | Status: DC | PRN
Start: 2018-01-06 — End: 2018-01-07

## 2018-01-06 MED ORDER — INSULIN REGULAR HUMAN(#) 1 UNIT/ML IJ SYRINGE
8 [IU] | Freq: Once | INTRAVENOUS | 0 refills | Status: CP
Start: 2018-01-06 — End: ?
  Administered 2018-01-06: 09:00:00 8 [IU] via INTRAVENOUS

## 2018-01-07 ENCOUNTER — Encounter: Admit: 2018-01-07 | Discharge: 2018-01-07

## 2018-01-08 ENCOUNTER — Ambulatory Visit: Admit: 2018-01-08 | Discharge: 2018-01-09 | Payer: 59

## 2018-01-08 ENCOUNTER — Encounter: Admit: 2018-01-08 | Discharge: 2018-01-08

## 2018-01-08 DIAGNOSIS — O24012 Pre-existing diabetes mellitus, type 1, in pregnancy, second trimester: Principal | ICD-10-CM

## 2018-01-08 DIAGNOSIS — Z3A23 23 weeks gestation of pregnancy: ICD-10-CM

## 2018-01-08 DIAGNOSIS — O99282 Endocrine, nutritional and metabolic diseases complicating pregnancy, second trimester: ICD-10-CM

## 2018-01-08 DIAGNOSIS — O0990 Supervision of high risk pregnancy, unspecified, unspecified trimester: Principal | ICD-10-CM

## 2018-01-08 DIAGNOSIS — E079 Disorder of thyroid, unspecified: ICD-10-CM

## 2018-01-08 DIAGNOSIS — E119 Type 2 diabetes mellitus without complications: Principal | ICD-10-CM

## 2018-01-09 ENCOUNTER — Encounter: Admit: 2018-01-09 | Discharge: 2018-01-09

## 2018-01-10 ENCOUNTER — Encounter: Admit: 2018-01-10 | Discharge: 2018-01-10

## 2018-01-13 ENCOUNTER — Encounter: Admit: 2018-01-13 | Discharge: 2018-01-13

## 2018-01-15 ENCOUNTER — Encounter: Admit: 2018-01-15 | Discharge: 2018-01-15

## 2018-01-15 ENCOUNTER — Ambulatory Visit: Admit: 2018-01-15 | Discharge: 2018-01-16 | Payer: 59

## 2018-01-15 DIAGNOSIS — E079 Disorder of thyroid, unspecified: ICD-10-CM

## 2018-01-15 DIAGNOSIS — O24012 Pre-existing diabetes mellitus, type 1, in pregnancy, second trimester: ICD-10-CM

## 2018-01-15 DIAGNOSIS — O0992 Supervision of high risk pregnancy, unspecified, second trimester: Principal | ICD-10-CM

## 2018-01-15 DIAGNOSIS — E119 Type 2 diabetes mellitus without complications: Principal | ICD-10-CM

## 2018-01-15 DIAGNOSIS — Z3A24 24 weeks gestation of pregnancy: ICD-10-CM

## 2018-01-15 DIAGNOSIS — O99282 Endocrine, nutritional and metabolic diseases complicating pregnancy, second trimester: ICD-10-CM

## 2018-01-15 MED ORDER — BLOOD SUGAR DIAGNOSTIC MISC STRP
ORAL_STRIP | Freq: Every day | 1 refills | 30.00000 days | Status: AC
Start: 2018-01-15 — End: 2018-01-16

## 2018-01-16 ENCOUNTER — Encounter: Admit: 2018-01-16 | Discharge: 2018-01-16

## 2018-01-16 DIAGNOSIS — O24012 Pre-existing diabetes mellitus, type 1, in pregnancy, second trimester: Principal | ICD-10-CM

## 2018-01-16 LAB — SURESWAB CHLAMYDIA/N. GONORRHOEAE RNA

## 2018-01-16 MED ORDER — BLOOD SUGAR DIAGNOSTIC MISC STRP
1 | ORAL_STRIP | Freq: Four times a day (QID) | 3 refills | 30.00000 days | Status: AC
Start: 2018-01-16 — End: ?

## 2018-01-20 ENCOUNTER — Encounter: Admit: 2018-01-20 | Discharge: 2018-01-20

## 2018-01-28 ENCOUNTER — Encounter: Admit: 2018-01-28 | Discharge: 2018-01-28

## 2018-01-30 ENCOUNTER — Encounter: Admit: 2018-01-30 | Discharge: 2018-01-30

## 2018-01-30 DIAGNOSIS — O24012 Pre-existing diabetes mellitus, type 1, in pregnancy, second trimester: Principal | ICD-10-CM

## 2018-01-30 DIAGNOSIS — O0992 Supervision of high risk pregnancy, unspecified, second trimester: ICD-10-CM

## 2018-01-30 MED ORDER — INSULIN ASPART U-100 100 UNIT/ML SC SOLN
4 refills | 42.00000 days | Status: AC
Start: 2018-01-30 — End: ?

## 2018-01-30 MED ORDER — INSULIN ASPART U-100 100 UNIT/ML SC SOLN
1 refills | 42.00000 days | Status: AC
Start: 2018-01-30 — End: 2018-01-30

## 2018-02-04 ENCOUNTER — Encounter: Admit: 2018-02-04 | Discharge: 2018-02-04

## 2018-02-12 ENCOUNTER — Ambulatory Visit: Admit: 2018-02-12 | Discharge: 2018-02-13 | Payer: 59

## 2018-02-12 ENCOUNTER — Encounter: Admit: 2018-02-12 | Discharge: 2018-02-12

## 2018-02-12 DIAGNOSIS — O0993 Supervision of high risk pregnancy, unspecified, third trimester: ICD-10-CM

## 2018-02-12 DIAGNOSIS — O0992 Supervision of high risk pregnancy, unspecified, second trimester: Principal | ICD-10-CM

## 2018-02-12 DIAGNOSIS — O99282 Endocrine, nutritional and metabolic diseases complicating pregnancy, second trimester: ICD-10-CM

## 2018-02-12 DIAGNOSIS — E079 Disorder of thyroid, unspecified: ICD-10-CM

## 2018-02-12 DIAGNOSIS — E119 Type 2 diabetes mellitus without complications: Principal | ICD-10-CM

## 2018-02-12 DIAGNOSIS — O24012 Pre-existing diabetes mellitus, type 1, in pregnancy, second trimester: Principal | ICD-10-CM

## 2018-02-16 ENCOUNTER — Inpatient Hospital Stay
Admit: 2018-02-16 | Discharge: 2018-02-21 | Discharge status: Against Medical Advice | Payer: 59 | Attending: Maternal & Fetal Medicine | Admitting: Maternal & Fetal Medicine

## 2018-02-16 ENCOUNTER — Encounter: Admit: 2018-02-16 | Discharge: 2018-02-16

## 2018-02-16 DIAGNOSIS — E079 Disorder of thyroid, unspecified: ICD-10-CM

## 2018-02-16 DIAGNOSIS — E119 Type 2 diabetes mellitus without complications: Principal | ICD-10-CM

## 2018-02-16 DIAGNOSIS — O24019 Pre-existing diabetes mellitus, type 1, in pregnancy, unspecified trimester: ICD-10-CM

## 2018-02-16 LAB — BLOOD GASES, PERIPHERAL VENOUS
Lab: 1.1 MMOL/L — ABNORMAL HIGH (ref 7–25)
Lab: 23 MMOL/L (ref 8.5–10.6)
Lab: 33 mmHg — ABNORMAL LOW (ref 36–50)
Lab: 7.4 MMOL/L — ABNORMAL HIGH (ref 7.30–7.40)
Lab: 76 mmHg — ABNORMAL HIGH (ref 33–48)
Lab: 95 % — ABNORMAL HIGH (ref 55–71)

## 2018-02-16 LAB — COMPREHENSIVE METABOLIC PANEL
Lab: 0.4 mg/dL — ABNORMAL LOW (ref >60)
Lab: 12 (ref 3–12)
Lab: 132 MMOL/L — ABNORMAL LOW (ref 137–147)
Lab: 17 U/L (ref 7–56)
Lab: 20 MMOL/L — ABNORMAL LOW (ref 21–30)
Lab: 21 U/L (ref 7–40)
Lab: 3.4 g/dL — ABNORMAL LOW (ref 3.5–5.0)
Lab: 6.3 g/dL — ABNORMAL LOW (ref >60)
Lab: 86 U/L (ref 25–110)
Lab: >60 mL/min (ref >60)
Lab: >60 mL/min (ref >60)

## 2018-02-16 LAB — BETA HYDROXYBUTYRATE (KETONES): Lab: 0.5 MMOL/L — ABNORMAL HIGH (ref <0.3)

## 2018-02-16 LAB — CBC
Lab: 12 % (ref 11–15)
Lab: 12 g/dL (ref 12.0–15.0)
Lab: 15 10*3/uL — ABNORMAL HIGH (ref 4.5–11.0)
Lab: 235 10*3/uL (ref 150–400)
Lab: 28 pg (ref 26–34)
Lab: 33 g/dL (ref 32.0–36.0)
Lab: 37 % (ref 36–45)
Lab: 4.4 M/UL (ref 4.0–5.0)
Lab: 8.7 FL (ref 7–11)
Lab: 85 FL (ref 80–100)

## 2018-02-16 LAB — POC GLUCOSE
Lab: 300 mg/dL — ABNORMAL HIGH (ref 70–100)
Lab: 227 mg/dL — ABNORMAL HIGH (ref 70–100)

## 2018-02-16 MED ORDER — INSULIN ASPART 100 UNIT/ML SC FLEXPEN
12 [IU] | Freq: Once | SUBCUTANEOUS | 0 refills | Status: CP
Start: 2018-02-16 — End: ?
  Administered 2018-02-17: 01:00:00 12 [IU] via SUBCUTANEOUS

## 2018-02-16 MED ORDER — LACTATED RINGERS IV SOLP
INTRAVENOUS | 0 refills | Status: DC
Start: 2018-02-16 — End: 2018-02-16

## 2018-02-16 MED ORDER — DIPHTH,PERTUS(ACELL),TETANUS 2.5-8-5 LF-MCG-LF/0.5ML IM SUSP
.5 mL | Freq: Once | INTRAMUSCULAR | 0 refills | Status: CP
Start: 2018-02-16 — End: ?
  Administered 2018-02-17: 23:00:00 0.5 mL via INTRAMUSCULAR

## 2018-02-16 MED ORDER — INSULIN PUMP - ASPART
Freq: Three times a day (TID) | SUBCUTANEOUS | 0 refills | Status: DC | PRN
Start: 2018-02-16 — End: 2018-02-17

## 2018-02-16 MED ORDER — LEVOTHYROXINE 50 MCG PO TAB
50 ug | Freq: Every day | ORAL | 0 refills | Status: DC
Start: 2018-02-16 — End: 2018-02-21
  Administered 2018-02-17 – 2018-02-21 (×5): 50 ug via ORAL

## 2018-02-16 MED ORDER — INSULIN REGULAR IN 0.9 % NACL 100 UNIT/100 ML (1 UNIT/ML) IV SOLN
1-32 [IU]/h | INTRAVENOUS | 0 refills | Status: DC
Start: 2018-02-16 — End: 2018-02-17
  Administered 2018-02-16: 23:00:00 1 [IU]/h via INTRAVENOUS

## 2018-02-16 MED ORDER — MISOPROSTOL 200 MCG PO TAB
800 ug | Freq: Once | RECTAL | 0 refills | Status: AC
Start: 2018-02-16 — End: ?

## 2018-02-16 MED ORDER — SODIUM CITRATE-CITRIC ACID 500-334 MG/5 ML PO SOLN
30 mL | Freq: Once | ORAL | 0 refills | Status: AC | PRN
Start: 2018-02-16 — End: ?

## 2018-02-16 MED ORDER — OXYTOCIN IN 0.9 % SOD CHLORIDE 30 UNIT/500 ML IV SOLN
24 [IU] | Freq: Once | INTRAVENOUS | 0 refills | Status: AC
Start: 2018-02-16 — End: ?

## 2018-02-16 MED ORDER — ACETAMINOPHEN 500 MG PO TAB
1000 mg | ORAL | 0 refills | Status: DC | PRN
Start: 2018-02-16 — End: 2018-02-21
  Administered 2018-02-17: 04:00:00 1000 mg via ORAL

## 2018-02-16 MED ORDER — INSULIN ASPART 100 UNIT/ML SC FLEXPEN
1-20 [IU] | Freq: Three times a day (TID) | SUBCUTANEOUS | 0 refills | Status: DC
Start: 2018-02-16 — End: 2018-02-17
  Administered 2018-02-17: 15:00:00 11 [IU] via SUBCUTANEOUS

## 2018-02-16 MED ORDER — SODIUM CHLORIDE 0.9 % IV SOLP
1000 mL | INTRAVENOUS | 0 refills | Status: DC
Start: 2018-02-16 — End: 2018-02-17

## 2018-02-16 MED ADMIN — SODIUM CHLORIDE 0.9 % IV SOLP [27838]: 1000 mL | INTRAVENOUS | @ 23:00:00 | Stop: 2018-02-16 | NDC 00338004904

## 2018-02-17 LAB — SYPHILIS AB SCREEN: Lab: NEGATIVE

## 2018-02-17 LAB — POC GLUCOSE
Lab: 105 mg/dL — ABNORMAL HIGH (ref 70–100)
Lab: 115 mg/dL — ABNORMAL HIGH (ref 70–100)
Lab: 119 mg/dL — ABNORMAL HIGH (ref 70–100)
Lab: 127 mg/dL — ABNORMAL HIGH (ref 70–100)
Lab: 131 mg/dL — ABNORMAL HIGH (ref 70–100)
Lab: 140 mg/dL — ABNORMAL HIGH (ref 70–100)
Lab: 145 mg/dL — ABNORMAL HIGH (ref 70–100)
Lab: 145 mg/dL — ABNORMAL HIGH (ref 70–100)
Lab: 150 mg/dL — ABNORMAL HIGH (ref >60)
Lab: 158 mg/dL — ABNORMAL HIGH (ref 70–100)
Lab: 162 mg/dL — ABNORMAL HIGH (ref 70–100)
Lab: 171 mg/dL — ABNORMAL HIGH (ref 70–100)
Lab: 179 mg/dL — ABNORMAL HIGH (ref 70–100)
Lab: 179 mg/dL — ABNORMAL HIGH (ref 70–100)
Lab: 201 mg/dL — ABNORMAL HIGH (ref 70–100)
Lab: 70 mg/dL (ref 70–100)
Lab: 82 mg/dL (ref 70–100)
Lab: 84 mg/dL — ABNORMAL LOW (ref 70–100)
Lab: 91 mg/dL (ref 70–100)

## 2018-02-17 MED ORDER — INSULIN PUMP - ASPART
Freq: Three times a day (TID) | SUBCUTANEOUS | 0 refills | Status: DC | PRN
Start: 2018-02-17 — End: 2018-02-18

## 2018-02-17 MED ORDER — INSULIN PUMP - ASPART
Freq: Three times a day (TID) | SUBCUTANEOUS | 0 refills | Status: DC | PRN
Start: 2018-02-17 — End: 2018-02-17

## 2018-02-18 LAB — POC GLUCOSE
Lab: 126 mg/dL — ABNORMAL HIGH (ref 70–100)
Lab: 126 mg/dL — ABNORMAL HIGH (ref 70–100)
Lab: 129 mg/dL — ABNORMAL HIGH (ref 70–100)
Lab: 138 mg/dL — ABNORMAL HIGH (ref 70–100)
Lab: 149 mg/dL — ABNORMAL HIGH (ref 70–100)
Lab: 153 mg/dL — ABNORMAL HIGH (ref 70–100)
Lab: 162 mg/dL — ABNORMAL HIGH (ref 70–100)
Lab: 175 mg/dL — ABNORMAL HIGH (ref 70–100)
Lab: 67 mg/dL — ABNORMAL LOW (ref 70–100)
Lab: 72 mg/dL (ref 70–100)
Lab: 72 mg/dL (ref 70–100)

## 2018-02-18 MED ORDER — INSULIN REGULAR HUMAN(#) 1 UNIT/ML IJ SYRINGE
2 [IU] | Freq: Once | INTRAVENOUS | 0 refills | Status: CP
Start: 2018-02-18 — End: ?

## 2018-02-18 MED ORDER — INSULIN REGULAR HUMAN(#) 1 UNIT/ML IJ SYRINGE
2 [IU] | Freq: Once | INTRAVENOUS | 0 refills | Status: CP
Start: 2018-02-18 — End: ?
  Administered 2018-02-18: 20:00:00 2 [IU] via INTRAVENOUS

## 2018-02-18 MED ORDER — INSULIN PUMP - ASPART
Freq: Three times a day (TID) | SUBCUTANEOUS | 0 refills | Status: DC | PRN
Start: 2018-02-18 — End: 2018-02-19

## 2018-02-18 MED ORDER — INSULIN REGULAR HUMAN(#) 1 UNIT/ML IJ SYRINGE
3 [IU] | Freq: Once | INTRAVENOUS | 0 refills | Status: CP
Start: 2018-02-18 — End: ?
  Administered 2018-02-18: 23:00:00 3 [IU] via INTRAVENOUS

## 2018-02-18 MED ORDER — INSULIN REGULAR HUMAN(#) 1 UNIT/ML IJ SYRINGE
6 [IU] | Freq: Once | INTRAVENOUS | 0 refills | Status: CP
Start: 2018-02-18 — End: ?
  Administered 2018-02-19: 05:00:00 6 [IU] via INTRAVENOUS

## 2018-02-18 MED ORDER — INSULIN REGULAR HUMAN(#) 1 UNIT/ML IJ SYRINGE
1-20 [IU] | INTRAVENOUS | 0 refills | Status: DC
Start: 2018-02-18 — End: 2018-02-21
  Administered 2018-02-21 (×3): 4 [IU] via INTRAVENOUS

## 2018-02-18 MED ADMIN — INSULIN REGULAR HUMAN(#) 1 UNIT/ML IJ SYRINGE [211626]: 2 [IU] | INTRAVENOUS | @ 19:00:00 | Stop: 2018-02-18 | NDC 54029038702

## 2018-02-19 LAB — POC GLUCOSE
Lab: 119 mg/dL — ABNORMAL HIGH (ref 70–100)
Lab: 180 mg/dL — ABNORMAL HIGH (ref 70–100)
Lab: 156 mg/dL — ABNORMAL HIGH (ref 70–100)
Lab: 132 mg/dL — ABNORMAL HIGH (ref 70–100)
Lab: 144 mg/dL — ABNORMAL HIGH (ref 70–100)
Lab: 133 mg/dL — ABNORMAL HIGH (ref 70–100)
Lab: 128 mg/dL — ABNORMAL HIGH (ref 70–100)
Lab: 125 mg/dL — ABNORMAL HIGH (ref 70–100)
Lab: 150 mg/dL — ABNORMAL HIGH (ref 70–100)
Lab: 93 mg/dL (ref 70–100)
Lab: 108 mg/dL — ABNORMAL HIGH (ref 70–100)

## 2018-02-19 LAB — CBC
Lab: 12 % (ref 11–15)
Lab: 13 K/UL — ABNORMAL HIGH (ref >60)
Lab: 237 K/UL — ABNORMAL LOW (ref 150–400)
Lab: 28 pg (ref 26–34)
Lab: 33 g/dL (ref 32.0–36.0)
Lab: 8.2 FL (ref 7–11)
Lab: 83 FL (ref 80–100)

## 2018-02-19 LAB — PROTEIN/CR RATIO,UR RAN
Lab: 0.2 % — ABNORMAL LOW (ref 36–45)
Lab: 57 mg/dL — ABNORMAL HIGH (ref 12.0–15.0)
Lab: 9 mg/dL (ref >60)

## 2018-02-19 LAB — COMPREHENSIVE METABOLIC PANEL
Lab: 136 MMOL/L — ABNORMAL LOW (ref 137–147)
Lab: 14 U/L (ref 7–56)
Lab: 21 U/L (ref 7–40)
Lab: 23 MMOL/L (ref 21–30)
Lab: >60 mL/min (ref >60)
Lab: >60 mL/min (ref >60)
Lab: 9 (ref 3–12)

## 2018-02-19 MED ORDER — INSULIN REGULAR HUMAN(#) 1 UNIT/ML IJ SYRINGE
4 [IU] | Freq: Once | INTRAVENOUS | 0 refills | Status: CP
Start: 2018-02-19 — End: ?
  Administered 2018-02-19: 13:00:00 4 [IU] via INTRAVENOUS

## 2018-02-19 MED ORDER — INSULIN REGULAR HUMAN(#) 1 UNIT/ML IJ SYRINGE
5 [IU] | Freq: Once | INTRAVENOUS | 0 refills | Status: CP
Start: 2018-02-19 — End: ?
  Administered 2018-02-20: 04:00:00 5 [IU] via INTRAVENOUS

## 2018-02-19 MED ORDER — INSULIN PUMP - ASPART
Freq: Three times a day (TID) | SUBCUTANEOUS | 0 refills | Status: DC | PRN
Start: 2018-02-19 — End: 2018-02-20

## 2018-02-19 MED ORDER — INSULIN PUMP - ASPART
Freq: Three times a day (TID) | SUBCUTANEOUS | 0 refills | Status: DC | PRN
Start: 2018-02-19 — End: 2018-02-19

## 2018-02-20 LAB — POC GLUCOSE
Lab: 70 mg/dL (ref 70–100)
Lab: 176 mg/dL — ABNORMAL HIGH (ref 70–100)
Lab: 185 mg/dL — ABNORMAL HIGH (ref 70–100)
Lab: 145 mg/dL — ABNORMAL HIGH (ref 70–100)
Lab: 145 mg/dL — ABNORMAL HIGH (ref 70–100)
Lab: 142 mg/dL — ABNORMAL HIGH (ref 70–100)
Lab: 151 mg/dL — ABNORMAL HIGH (ref 70–100)
Lab: 104 mg/dL — ABNORMAL HIGH (ref 70–100)
Lab: 136 mg/dL — ABNORMAL HIGH (ref 70–100)
Lab: 67 mg/dL — ABNORMAL LOW (ref 70–100)
Lab: 134 mg/dL — ABNORMAL HIGH (ref 70–100)

## 2018-02-20 MED ORDER — SODIUM CHLORIDE 0.9 % FLUSH
3-5 mL | Freq: Three times a day (TID) | 0 refills | Status: DC
Start: 2018-02-20 — End: 2018-02-21

## 2018-02-20 MED ORDER — INSULIN REGULAR HUMAN(#) 1 UNIT/ML IJ SYRINGE
6 [IU] | Freq: Once | INTRAVENOUS | 0 refills | Status: AC
Start: 2018-02-20 — End: ?

## 2018-02-20 MED ORDER — INSULIN REGULAR HUMAN(#) 1 UNIT/ML IJ SYRINGE
6 [IU] | INTRAVENOUS | 0 refills | Status: DC
Start: 2018-02-20 — End: 2018-02-20
  Administered 2018-02-20: 13:00:00 6 [IU] via INTRAVENOUS

## 2018-02-20 MED ORDER — INSULIN PUMP - ASPART
Freq: Three times a day (TID) | SUBCUTANEOUS | 0 refills | Status: DC | PRN
Start: 2018-02-20 — End: 2018-02-21

## 2018-02-21 ENCOUNTER — Encounter: Admit: 2018-02-21 | Discharge: 2018-02-21

## 2018-02-21 ENCOUNTER — Inpatient Hospital Stay: Admit: 2018-02-16 | Discharge: 2018-02-16

## 2018-02-21 DIAGNOSIS — Z794 Long term (current) use of insulin: ICD-10-CM

## 2018-02-21 DIAGNOSIS — R51 Headache: ICD-10-CM

## 2018-02-21 DIAGNOSIS — Z9114 Patient's other noncompliance with medication regimen: ICD-10-CM

## 2018-02-21 DIAGNOSIS — Z3A28 28 weeks gestation of pregnancy: ICD-10-CM

## 2018-02-21 DIAGNOSIS — E039 Hypothyroidism, unspecified: ICD-10-CM

## 2018-02-21 DIAGNOSIS — O133 Gestational [pregnancy-induced] hypertension without significant proteinuria, third trimester: ICD-10-CM

## 2018-02-21 DIAGNOSIS — O24013 Pre-existing diabetes mellitus, type 1, in pregnancy, third trimester: Principal | ICD-10-CM

## 2018-02-21 DIAGNOSIS — E1065 Type 1 diabetes mellitus with hyperglycemia: ICD-10-CM

## 2018-02-21 DIAGNOSIS — O99283 Endocrine, nutritional and metabolic diseases complicating pregnancy, third trimester: ICD-10-CM

## 2018-02-21 DIAGNOSIS — Z9641 Presence of insulin pump (external) (internal): ICD-10-CM

## 2018-02-21 LAB — POC GLUCOSE
Lab: 76 mg/dL (ref 70–100)
Lab: 102 mg/dL — ABNORMAL HIGH (ref 70–100)
Lab: 155 mg/dL — ABNORMAL HIGH (ref 70–100)
Lab: 112 mg/dL — ABNORMAL HIGH (ref 70–100)
Lab: 132 mg/dL — ABNORMAL HIGH (ref 70–100)
Lab: 130 mg/dL — ABNORMAL HIGH (ref >60)
Lab: 104 mg/dL — ABNORMAL HIGH (ref 70–100)
Lab: 116 mg/dL — ABNORMAL HIGH (ref 70–100)
Lab: 168 mg/dL — ABNORMAL HIGH (ref 70–100)
Lab: 128 mg/dL — ABNORMAL HIGH (ref 70–100)

## 2018-02-21 MED ORDER — INSULIN REGULAR HUMAN(#) 1 UNIT/ML IJ SYRINGE
6 [IU] | Freq: Once | INTRAVENOUS | 0 refills | Status: CP
Start: 2018-02-21 — End: ?
  Administered 2018-02-21: 19:00:00 6 [IU] via INTRAVENOUS

## 2018-02-24 ENCOUNTER — Encounter: Admit: 2018-02-24 | Discharge: 2018-02-24

## 2018-02-26 ENCOUNTER — Ambulatory Visit: Admit: 2018-02-26 | Discharge: 2018-02-27 | Payer: 59

## 2018-02-26 ENCOUNTER — Encounter: Admit: 2018-02-26 | Discharge: 2018-02-26

## 2018-02-26 DIAGNOSIS — O0993 Supervision of high risk pregnancy, unspecified, third trimester: Principal | ICD-10-CM

## 2018-02-26 DIAGNOSIS — O99282 Endocrine, nutritional and metabolic diseases complicating pregnancy, second trimester: ICD-10-CM

## 2018-02-26 DIAGNOSIS — O24012 Pre-existing diabetes mellitus, type 1, in pregnancy, second trimester: Principal | ICD-10-CM

## 2018-02-26 DIAGNOSIS — E079 Disorder of thyroid, unspecified: ICD-10-CM

## 2018-02-26 DIAGNOSIS — E119 Type 2 diabetes mellitus without complications: Principal | ICD-10-CM

## 2018-03-05 ENCOUNTER — Encounter: Admit: 2018-03-05 | Discharge: 2018-03-05

## 2018-03-05 ENCOUNTER — Ambulatory Visit: Admit: 2018-03-05 | Discharge: 2018-03-06 | Payer: 59

## 2018-03-05 DIAGNOSIS — E038 Other specified hypothyroidism: ICD-10-CM

## 2018-03-05 DIAGNOSIS — O99282 Endocrine, nutritional and metabolic diseases complicating pregnancy, second trimester: ICD-10-CM

## 2018-03-05 DIAGNOSIS — O0993 Supervision of high risk pregnancy, unspecified, third trimester: Principal | ICD-10-CM

## 2018-03-05 DIAGNOSIS — Z3A31 31 weeks gestation of pregnancy: ICD-10-CM

## 2018-03-05 DIAGNOSIS — E119 Type 2 diabetes mellitus without complications: Principal | ICD-10-CM

## 2018-03-05 DIAGNOSIS — O24012 Pre-existing diabetes mellitus, type 1, in pregnancy, second trimester: Principal | ICD-10-CM

## 2018-03-05 DIAGNOSIS — O99283 Endocrine, nutritional and metabolic diseases complicating pregnancy, third trimester: ICD-10-CM

## 2018-03-05 DIAGNOSIS — E079 Disorder of thyroid, unspecified: ICD-10-CM

## 2018-03-05 DIAGNOSIS — O24013 Pre-existing diabetes mellitus, type 1, in pregnancy, third trimester: ICD-10-CM

## 2018-03-06 LAB — TSH WITH FREE T4 REFLEX: Lab: 1 m[IU]/L

## 2018-03-11 ENCOUNTER — Encounter: Admit: 2018-03-11 | Discharge: 2018-03-11

## 2018-03-12 ENCOUNTER — Encounter: Admit: 2018-03-12 | Discharge: 2018-03-12

## 2018-03-12 ENCOUNTER — Ambulatory Visit: Admit: 2018-03-12 | Discharge: 2018-03-13 | Payer: 59

## 2018-03-12 DIAGNOSIS — O24913 Unspecified diabetes mellitus in pregnancy, third trimester: Secondary | ICD-10-CM

## 2018-03-12 DIAGNOSIS — O133 Gestational [pregnancy-induced] hypertension without significant proteinuria, third trimester: Secondary | ICD-10-CM

## 2018-03-12 DIAGNOSIS — O99282 Endocrine, nutritional and metabolic diseases complicating pregnancy, second trimester: Secondary | ICD-10-CM

## 2018-03-12 DIAGNOSIS — O24013 Pre-existing diabetes mellitus, type 1, in pregnancy, third trimester: Secondary | ICD-10-CM

## 2018-03-12 DIAGNOSIS — E079 Disorder of thyroid, unspecified: Secondary | ICD-10-CM

## 2018-03-12 DIAGNOSIS — O0993 Supervision of high risk pregnancy, unspecified, third trimester: Secondary | ICD-10-CM

## 2018-03-12 DIAGNOSIS — E119 Type 2 diabetes mellitus without complications: Secondary | ICD-10-CM

## 2018-03-19 ENCOUNTER — Encounter: Admit: 2018-03-19 | Discharge: 2018-03-19

## 2018-03-19 ENCOUNTER — Ambulatory Visit: Admit: 2018-03-19 | Discharge: 2018-03-20 | Payer: 59

## 2018-03-19 DIAGNOSIS — O99282 Endocrine, nutritional and metabolic diseases complicating pregnancy, second trimester: Secondary | ICD-10-CM

## 2018-03-19 DIAGNOSIS — E119 Type 2 diabetes mellitus without complications: Secondary | ICD-10-CM

## 2018-03-19 DIAGNOSIS — O24913 Unspecified diabetes mellitus in pregnancy, third trimester: Secondary | ICD-10-CM

## 2018-03-19 DIAGNOSIS — O0993 Supervision of high risk pregnancy, unspecified, third trimester: Secondary | ICD-10-CM

## 2018-03-19 DIAGNOSIS — E079 Disorder of thyroid, unspecified: Secondary | ICD-10-CM

## 2018-03-19 DIAGNOSIS — O99283 Endocrine, nutritional and metabolic diseases complicating pregnancy, third trimester: Secondary | ICD-10-CM

## 2018-03-19 DIAGNOSIS — O24013 Pre-existing diabetes mellitus, type 1, in pregnancy, third trimester: Secondary | ICD-10-CM

## 2018-03-19 DIAGNOSIS — Z3A33 33 weeks gestation of pregnancy: Secondary | ICD-10-CM

## 2018-03-20 ENCOUNTER — Encounter: Admit: 2018-03-20 | Discharge: 2018-03-20

## 2018-03-26 ENCOUNTER — Encounter: Admit: 2018-03-26 | Discharge: 2018-03-26

## 2018-03-26 ENCOUNTER — Ambulatory Visit: Admit: 2018-03-26 | Discharge: 2018-03-27 | Payer: 59

## 2018-03-26 ENCOUNTER — Encounter: Admit: 2018-03-26 | Discharge: 2018-03-26 | Payer: 59

## 2018-03-26 DIAGNOSIS — E119 Type 2 diabetes mellitus without complications: Secondary | ICD-10-CM

## 2018-03-26 DIAGNOSIS — E079 Disorder of thyroid, unspecified: Secondary | ICD-10-CM

## 2018-03-26 DIAGNOSIS — O99283 Endocrine, nutritional and metabolic diseases complicating pregnancy, third trimester: Secondary | ICD-10-CM

## 2018-03-26 DIAGNOSIS — Z3490 Encounter for supervision of normal pregnancy, unspecified, unspecified trimester: ICD-10-CM

## 2018-03-26 DIAGNOSIS — Z3A34 34 weeks gestation of pregnancy: Secondary | ICD-10-CM

## 2018-03-26 DIAGNOSIS — O3443 Maternal care for other abnormalities of cervix, third trimester: Secondary | ICD-10-CM

## 2018-03-26 DIAGNOSIS — O24013 Pre-existing diabetes mellitus, type 1, in pregnancy, third trimester: Secondary | ICD-10-CM

## 2018-03-26 DIAGNOSIS — O99282 Endocrine, nutritional and metabolic diseases complicating pregnancy, second trimester: Secondary | ICD-10-CM

## 2018-03-26 DIAGNOSIS — O24913 Unspecified diabetes mellitus in pregnancy, third trimester: Secondary | ICD-10-CM

## 2018-03-26 DIAGNOSIS — O0993 Supervision of high risk pregnancy, unspecified, third trimester: Secondary | ICD-10-CM

## 2018-03-26 DIAGNOSIS — O133 Gestational [pregnancy-induced] hypertension without significant proteinuria, third trimester: Secondary | ICD-10-CM

## 2018-03-26 DIAGNOSIS — N841 Polyp of cervix uteri: Secondary | ICD-10-CM

## 2018-03-26 DIAGNOSIS — Z794 Long term (current) use of insulin: Secondary | ICD-10-CM

## 2018-03-26 DIAGNOSIS — Z79890 Hormone replacement therapy (postmenopausal): Secondary | ICD-10-CM

## 2018-03-26 LAB — URINALYSIS DIPSTICK
Lab: NEGATIVE
Lab: NEGATIVE
Lab: NEGATIVE

## 2018-03-26 LAB — CHLAM/NG PCR SWAB
Lab: NEGATIVE
Lab: NEGATIVE — AB

## 2018-03-26 LAB — URINALYSIS, MICROSCOPIC

## 2018-03-26 LAB — DIRECT EXAM (WET PREP)

## 2018-03-27 LAB — CULTURE-URINE W/SENSITIVITY

## 2018-03-29 ENCOUNTER — Encounter: Admit: 2018-03-29 | Discharge: 2018-03-29

## 2018-03-31 ENCOUNTER — Encounter: Admit: 2018-03-31 | Discharge: 2018-03-31

## 2018-03-31 DIAGNOSIS — G5603 Carpal tunnel syndrome, bilateral upper limbs: Secondary | ICD-10-CM

## 2018-03-31 DIAGNOSIS — O99283 Endocrine, nutritional and metabolic diseases complicating pregnancy, third trimester: Secondary | ICD-10-CM

## 2018-04-02 ENCOUNTER — Encounter: Admit: 2018-04-02 | Discharge: 2018-04-02

## 2018-04-02 ENCOUNTER — Ambulatory Visit: Admit: 2018-04-02 | Discharge: 2018-04-03 | Payer: 59

## 2018-04-02 DIAGNOSIS — E119 Type 2 diabetes mellitus without complications: Secondary | ICD-10-CM

## 2018-04-02 DIAGNOSIS — E079 Disorder of thyroid, unspecified: Secondary | ICD-10-CM

## 2018-04-02 DIAGNOSIS — O99283 Endocrine, nutritional and metabolic diseases complicating pregnancy, third trimester: Secondary | ICD-10-CM

## 2018-04-02 DIAGNOSIS — O99282 Endocrine, nutritional and metabolic diseases complicating pregnancy, second trimester: Secondary | ICD-10-CM

## 2018-04-02 DIAGNOSIS — O24913 Unspecified diabetes mellitus in pregnancy, third trimester: Secondary | ICD-10-CM

## 2018-04-02 DIAGNOSIS — O133 Gestational [pregnancy-induced] hypertension without significant proteinuria, third trimester: Secondary | ICD-10-CM

## 2018-04-07 ENCOUNTER — Encounter: Admit: 2018-04-07 | Discharge: 2018-04-07

## 2018-04-07 DIAGNOSIS — E119 Type 2 diabetes mellitus without complications: Secondary | ICD-10-CM

## 2018-04-07 DIAGNOSIS — E079 Disorder of thyroid, unspecified: Secondary | ICD-10-CM

## 2018-04-07 LAB — LDH-LACTATE DEHYDROGENASE: Lab: 229 U/L — ABNORMAL HIGH (ref 100–210)

## 2018-04-07 LAB — COMPREHENSIVE METABOLIC PANEL
Lab: 105 MMOL/L (ref 98–110)
Lab: 138 MMOL/L (ref 137–147)
Lab: 3.3 g/dL — ABNORMAL LOW (ref 3.5–5.0)
Lab: 3.5 MMOL/L (ref 3.5–5.1)
Lab: 34 U/L (ref 7–40)
Lab: 44 mg/dL — CL (ref 70–100)
Lab: 7 mg/dL (ref 7–25)
Lab: 9 mg/dL (ref 8.5–10.6)
Lab: >60 mL/min (ref >60)
Lab: >60 mL/min (ref >60)

## 2018-04-07 LAB — URIC ACID: Lab: 5.4 mg/dL (ref 2.0–7.0)

## 2018-04-07 LAB — CBC
Lab: 13 K/UL — ABNORMAL HIGH (ref 4.5–11.0)
Lab: 8.6 FL (ref 7–11)

## 2018-04-07 LAB — POC GLUCOSE
Lab: 46 mg/dL — CL (ref 70–100)
Lab: 80 mg/dL (ref 70–100)

## 2018-04-07 MED ORDER — PRENATAL VIT-IRON FUM-FOLIC AC 28 MG IRON- 800 MCG PO TAB
1 | Freq: Every day | ORAL | 0 refills | Status: DC
Start: 2018-04-07 — End: 2018-04-09
  Administered 2018-04-08: 14:00:00 1 via ORAL

## 2018-04-07 MED ORDER — INSULIN PUMP - ASPART
Freq: Three times a day (TID) | SUBCUTANEOUS | 0 refills | Status: DC | PRN
Start: 2018-04-07 — End: 2018-04-08

## 2018-04-07 MED ORDER — INSULIN ASPART 100 UNIT/ML SC FLEXPEN
24 [IU] | Freq: Every day | SUBCUTANEOUS | 0 refills | Status: DC
Start: 2018-04-07 — End: 2018-04-08

## 2018-04-07 MED ORDER — NPH INSULIN HUMAN RECOMB 100 UNIT/ML (3 ML) SC PEN
30 [IU] | Freq: Every day | SUBCUTANEOUS | 0 refills | Status: DC
Start: 2018-04-07 — End: 2018-04-08

## 2018-04-07 MED ORDER — INSULIN ASPART 100 UNIT/ML SC FLEXPEN
30 [IU] | Freq: Every day | SUBCUTANEOUS | 0 refills | Status: DC
Start: 2018-04-07 — End: 2018-04-08

## 2018-04-07 MED ORDER — INSULIN ASPART 100 UNIT/ML SC FLEXPEN
26 [IU] | Freq: Every day | SUBCUTANEOUS | 0 refills | Status: DC
Start: 2018-04-07 — End: 2018-04-08

## 2018-04-07 MED ORDER — NPH INSULIN HUMAN RECOMB 100 UNIT/ML (3 ML) SC PEN
40 [IU] | Freq: Every evening | SUBCUTANEOUS | 0 refills | Status: DC
Start: 2018-04-07 — End: 2018-04-08

## 2018-04-07 MED ORDER — INSULIN ASPART 100 UNIT/ML SC FLEXPEN
25 [IU] | Freq: Every day | SUBCUTANEOUS | 0 refills | Status: DC
Start: 2018-04-07 — End: 2018-04-08

## 2018-04-07 MED ORDER — ACETAMINOPHEN 500 MG PO TAB
1000 mg | Freq: Once | ORAL | 0 refills | Status: CP
Start: 2018-04-07 — End: ?
  Administered 2018-04-08: 05:00:00 1000 mg via ORAL

## 2018-04-07 MED ORDER — LEVOTHYROXINE 50 MCG PO TAB
50 ug | Freq: Every day | ORAL | 0 refills | Status: DC
Start: 2018-04-07 — End: 2018-04-12
  Administered 2018-04-08 – 2018-04-11 (×4): 50 ug via ORAL

## 2018-04-08 ENCOUNTER — Encounter: Admit: 2018-04-08 | Discharge: 2018-04-08

## 2018-04-08 ENCOUNTER — Inpatient Hospital Stay: Admit: 2018-04-08 | Discharge: 2018-04-08

## 2018-04-08 DIAGNOSIS — E119 Type 2 diabetes mellitus without complications: Secondary | ICD-10-CM

## 2018-04-08 DIAGNOSIS — E079 Disorder of thyroid, unspecified: Secondary | ICD-10-CM

## 2018-04-08 LAB — POC GLUCOSE
Lab: 48 mg/dL — CL (ref 70–100)
Lab: 75 mg/dL (ref 70–100)
Lab: 68 mg/dL — ABNORMAL LOW (ref >60)
Lab: 69 mg/dL — ABNORMAL LOW (ref 70–100)
Lab: 84 mg/dL (ref 70–100)
Lab: 69 mg/dL — ABNORMAL LOW (ref 70–100)
Lab: 75 mg/dL (ref 70–100)
Lab: 52 mg/dL — ABNORMAL LOW (ref 70–100)
Lab: 74 mg/dL (ref 70–100)
Lab: 119 mg/dL — ABNORMAL HIGH (ref 70–100)
Lab: 128 mg/dL — ABNORMAL HIGH (ref 70–100)
Lab: 130 mg/dL — ABNORMAL HIGH (ref 70–100)
Lab: 117 mg/dL — ABNORMAL HIGH (ref >60)
Lab: 109 mg/dL — ABNORMAL HIGH (ref 70–100)
Lab: 128 mg/dL — ABNORMAL HIGH (ref 70–100)

## 2018-04-08 LAB — PROTEIN/CR RATIO,UR RAN: Lab: 52 mg/dL (ref 36–45)

## 2018-04-08 LAB — URINALYSIS DIPSTICK
Lab: NEGATIVE
Lab: NEGATIVE
Lab: NEGATIVE
Lab: NEGATIVE
Lab: NEGATIVE
Lab: NEGATIVE

## 2018-04-08 LAB — URINALYSIS, MICROSCOPIC

## 2018-04-08 LAB — BLOOD GASES-CORD BLOOD
Lab: 13 mmHg
Lab: 60 mmHg
Lab: 7.2 mmHg
Lab: 9.7 %
Lab: 7.1 — CL

## 2018-04-08 LAB — SYPHILIS AB SCREEN: Lab: NEGATIVE

## 2018-04-08 LAB — CULTURE-URINE W/SENSITIVITY

## 2018-04-08 MED ORDER — OXYTOCIN IN 0.9 % SOD CHLORIDE 30 UNIT/500 ML IV SOLN
.5-20 m[IU]/min | INTRAVENOUS | 0 refills | Status: DC
Start: 2018-04-08 — End: 2018-04-08
  Administered 2018-04-08: 14:00:00 2 m[IU]/min via INTRAVENOUS

## 2018-04-08 MED ORDER — NALOXONE 0.4 MG/ML IJ SOLN
.08 mg | INTRAVENOUS | 0 refills | Status: DC | PRN
Start: 2018-04-08 — End: 2018-04-09

## 2018-04-08 MED ORDER — INSULIN REGULAR HUMAN(#) 1 UNIT/ML IJ SYRINGE
2 [IU] | Freq: Once | INTRAVENOUS | 0 refills | Status: DC
Start: 2018-04-08 — End: 2018-04-09

## 2018-04-08 MED ORDER — INSULIN REGULAR HUMAN(#) 1 UNIT/ML IJ SYRINGE
2 [IU] | Freq: Once | INTRAVENOUS | 0 refills | Status: CP
Start: 2018-04-08 — End: ?

## 2018-04-08 MED ORDER — CEFAZOLIN 1 GRAM IJ SOLR
INTRAVENOUS | 0 refills | Status: DC
Start: 2018-04-08 — End: 2018-04-08
  Administered 2018-04-08: 21:00:00 2 g via INTRAVENOUS

## 2018-04-08 MED ORDER — DOCUSATE SODIUM 100 MG PO CAP
100 mg | Freq: Two times a day (BID) | ORAL | 0 refills | Status: DC
Start: 2018-04-08 — End: 2018-04-12
  Administered 2018-04-09 – 2018-04-11 (×6): 100 mg via ORAL

## 2018-04-08 MED ORDER — MAGNESIUM BOLUS FOR CONTINUOUS INFUSION
4 g | Freq: Once | INTRAVENOUS | 0 refills | Status: CP
Start: 2018-04-08 — End: ?

## 2018-04-08 MED ORDER — ATROPINE 0.1 MG/ML IJ SYRG
0.5-1 mg | Freq: Once | INTRAVENOUS | 0 refills | Status: AC
Start: 2018-04-08 — End: ?

## 2018-04-08 MED ORDER — SIMETHICONE 80 MG PO CHEW
80 mg | ORAL | 0 refills | Status: DC | PRN
Start: 2018-04-08 — End: 2018-04-12

## 2018-04-08 MED ORDER — OXYCODONE 5 MG PO TAB
5-10 mg | ORAL | 0 refills | Status: DC | PRN
Start: 2018-04-08 — End: 2018-04-12
  Administered 2018-04-08: 23:00:00 10 mg via ORAL
  Administered 2018-04-10 – 2018-04-11 (×9): 5 mg via ORAL

## 2018-04-08 MED ORDER — PENICILLIN G POTASSIUM 5MU/100ML NS IVPB
5 10*6.[IU] | Freq: Once | INTRAVENOUS | 0 refills | Status: CP
Start: 2018-04-08 — End: ?
  Administered 2018-04-08 (×2): 5 10*6.[IU] via INTRAVENOUS

## 2018-04-08 MED ORDER — LACTATED RINGERS IV SOLP
INTRAVENOUS | 0 refills | Status: DC
Start: 2018-04-08 — End: 2018-04-08

## 2018-04-08 MED ORDER — FENTANYL CITRATE (PF) 50 MCG/ML IJ SOLN
50 ug | Freq: Once | INTRAVENOUS | 0 refills | Status: CP
Start: 2018-04-08 — End: ?
  Administered 2018-04-08: 09:00:00 50 ug via INTRAVENOUS

## 2018-04-08 MED ORDER — OXYTOCIN IN 0.9 % SOD CHLORIDE 30 UNIT/500 ML IV SOLN
30 [IU] | Freq: Once | INTRAVENOUS | 0 refills | Status: DC
Start: 2018-04-08 — End: 2018-04-09

## 2018-04-08 MED ORDER — MISOPROSTOL 200 MCG PO TAB
800 ug | Freq: Once | RECTAL | 0 refills | Status: CP
Start: 2018-04-08 — End: ?
  Administered 2018-04-08: 22:00:00 800 ug via RECTAL

## 2018-04-08 MED ORDER — FENTANYL CITRATE (PF) 50 MCG/ML IJ SOLN
0 refills | Status: DC
Start: 2018-04-08 — End: 2018-04-08
  Administered 2018-04-08: 22:00:00 50 ug via INTRAVENOUS
  Administered 2018-04-08: 21:00:00 100 ug via EPIDURAL

## 2018-04-08 MED ORDER — LACTATED RINGERS IV SOLP
INTRAVENOUS | 0 refills | Status: DC
Start: 2018-04-08 — End: 2018-04-08
  Administered 2018-04-08 (×2): 1000.000 mL via INTRAVENOUS

## 2018-04-08 MED ORDER — OXYTOCIN IN 0.9 % SOD CHLORIDE 30 UNIT/500 ML IV SOLN (OR)
INTRAVENOUS | 0 refills | Status: DC
Start: 2018-04-08 — End: 2018-04-08
  Administered 2018-04-08: 21:00:00 via INTRAVENOUS

## 2018-04-08 MED ORDER — LIDOCAINE (PF) 20 MG/ML (2 %) IJ SOLN
0 refills | Status: DC
Start: 2018-04-08 — End: 2018-04-08
  Administered 2018-04-08: 21:00:00 2 mL via EPIDURAL
  Administered 2018-04-08 (×2): 3 mL via EPIDURAL

## 2018-04-08 MED ORDER — METOCLOPRAMIDE HCL 5 MG/ML IJ SOLN
INTRAVENOUS | 0 refills | Status: DC
Start: 2018-04-08 — End: 2018-04-08
  Administered 2018-04-08: 21:00:00 10 mg via INTRAVENOUS

## 2018-04-08 MED ORDER — IBUPROFEN 600 MG PO TAB
600 mg | ORAL | 0 refills | Status: CP
Start: 2018-04-08 — End: ?
  Administered 2018-04-09 – 2018-04-11 (×12): 600 mg via ORAL

## 2018-04-08 MED ORDER — PENICILLIN G POTASSIUM IVPB
2.5 10*6.[IU] | INTRAVENOUS | 0 refills | Status: DC
Start: 2018-04-08 — End: 2018-04-08
  Administered 2018-04-08 (×6): 2.5 10*6.[IU] via INTRAVENOUS

## 2018-04-08 MED ORDER — MISOPROSTOL  25 MCG PO TAB
25 ug | VAGINAL | 0 refills | Status: AC
Start: 2018-04-08 — End: ?
  Administered 2018-04-08: 09:00:00 25 ug via VAGINAL

## 2018-04-08 MED ORDER — FAMOTIDINE (PF) 20 MG/2 ML IV SOLN
INTRAVENOUS | 0 refills | Status: DC
Start: 2018-04-08 — End: 2018-04-08
  Administered 2018-04-08: 21:00:00 20 mg via INTRAVENOUS

## 2018-04-08 MED ORDER — ONDANSETRON HCL (PF) 4 MG/2 ML IJ SOLN
4 mg | INTRAVENOUS | 0 refills | Status: DC | PRN
Start: 2018-04-08 — End: 2018-04-12

## 2018-04-08 MED ORDER — LACTATED RINGERS IV SOLP
INTRAVENOUS | 0 refills | Status: AC
Start: 2018-04-08 — End: ?
  Administered 2018-04-09 (×3): 1000.000 mL via INTRAVENOUS

## 2018-04-08 MED ORDER — PRENATAL VIT-IRON FUM-FOLIC AC 28 MG IRON- 800 MCG PO TAB
1 | Freq: Every evening | ORAL | 0 refills | Status: DC
Start: 2018-04-08 — End: 2018-04-12
  Administered 2018-04-10 – 2018-04-11 (×2): 1 via ORAL

## 2018-04-08 MED ORDER — DIPHTH,PERTUS(ACELL),TETANUS 2.5-8-5 LF-MCG-LF/0.5ML IM SUSP
.5 mL | Freq: Once | INTRAMUSCULAR | 0 refills | Status: DC
Start: 2018-04-08 — End: 2018-04-09

## 2018-04-08 MED ORDER — FAMOTIDINE 20 MG PO TAB
20 mg | Freq: Two times a day (BID) | ORAL | 0 refills | Status: DC | PRN
Start: 2018-04-08 — End: 2018-04-12

## 2018-04-08 MED ORDER — ACETAMINOPHEN 325 MG PO TAB
650 mg | ORAL | 0 refills | Status: AC
Start: 2018-04-08 — End: ?
  Administered 2018-04-08 – 2018-04-11 (×14): 650 mg via ORAL

## 2018-04-08 MED ORDER — CEFAZOLIN INJ 1GM IVP
1 g | INTRAVENOUS | 0 refills | Status: CP
Start: 2018-04-08 — End: ?
  Administered 2018-04-09 (×3): 1 g via INTRAVENOUS

## 2018-04-08 MED ORDER — LIDOCAINE-EPINEPHRINE (PF) 1.5 %-1:200,000 IJ SOLN (OR)
0 refills | Status: CP
Start: 2018-04-08 — End: ?

## 2018-04-08 MED ORDER — PHENYLEPHRINE IN 0.9% NACL(PF) 2 MG/20 ML (100 MCG/ML) IV SYRG (INFUSION)(AM)(OR)
INTRAVENOUS | 0 refills | Status: DC
Start: 2018-04-08 — End: 2018-04-08
  Administered 2018-04-08: 21:00:00 0.4 ug/kg/min via INTRAVENOUS

## 2018-04-08 MED ORDER — FLU VACC QS2019-20 6MOS UP(PF) 60 MCG (15 MCG X 4)/0.5 ML IM SYRG
.5 mL | Freq: Once | INTRAMUSCULAR | 0 refills | Status: DC
Start: 2018-04-08 — End: 2018-04-09

## 2018-04-08 MED ORDER — FENTANYL/BUPIVACAINE 2 MCG/ML-0.1% PCA EPIDURAL SYRINGE
EPIDURAL | 0 refills | Status: DC
Start: 2018-04-08 — End: 2018-04-08
  Administered 2018-04-08: 20:00:00 50.000 mL via EPIDURAL

## 2018-04-08 MED ORDER — MAGNESIUM SULFATE IN WATER 40 GRAM/1,000 ML (4 %) IV SOLP
1 g/h | INTRAVENOUS | 0 refills | Status: AC
Start: 2018-04-08 — End: ?
  Administered 2018-04-09: 20:00:00 1 g/h via INTRAVENOUS

## 2018-04-08 MED ORDER — AZITHROMYCIN 500 MG IVPB (AN)(OSM)
INTRAVENOUS | 0 refills | Status: DC
Start: 2018-04-08 — End: 2018-04-08
  Administered 2018-04-08 (×2): 500 mg via INTRAVENOUS

## 2018-04-08 MED ORDER — OXYTOCIN IN 0.9 % SOD CHLORIDE 30 UNIT/500 ML IV SOLN
30 [IU] | Freq: Once | INTRAVENOUS | 0 refills | Status: AC | PRN
Start: 2018-04-08 — End: ?

## 2018-04-08 MED ORDER — DEXTROSE 5%-0.9% SODIUM CHLORIDE IV SOLP
INTRAVENOUS | 0 refills | Status: DC
Start: 2018-04-08 — End: 2018-04-08
  Administered 2018-04-08: 11:00:00 1000.000 mL via INTRAVENOUS

## 2018-04-08 MED ORDER — MORPHINE 4 MG/ML IV CRTG
1-2 mg | INTRAVENOUS | 0 refills | Status: AC | PRN
Start: 2018-04-08 — End: ?

## 2018-04-08 MED ORDER — LANOLIN TP CREA
TOPICAL | 0 refills | Status: DC | PRN
Start: 2018-04-08 — End: 2018-04-12

## 2018-04-08 MED ORDER — SODIUM CITRATE-CITRIC ACID 500-334 MG/5 ML PO SOLN
ORAL | 0 refills | Status: DC
Start: 2018-04-08 — End: 2018-04-08
  Administered 2018-04-08: 21:00:00 30 mL via ORAL

## 2018-04-08 MED ORDER — SODIUM CITRATE-CITRIC ACID 500-334 MG/5 ML PO SOLN
30 mL | Freq: Once | ORAL | 0 refills | Status: CP | PRN
Start: 2018-04-08 — End: ?
  Administered 2018-04-08: 21:00:00 30 mL via ORAL

## 2018-04-08 MED ORDER — MORPHINE (PF) 1 MG/ML IJ SOLN
EPIDURAL | 0 refills | Status: DC
Start: 2018-04-08 — End: 2018-04-08
  Administered 2018-04-08: 21:00:00 20000 ug via EPIDURAL

## 2018-04-08 MED ORDER — LACTATED RINGERS IV SOLP
0 refills | Status: DC
Start: 2018-04-08 — End: 2018-04-08
  Administered 2018-04-08: 21:00:00 via INTRAVENOUS

## 2018-04-08 MED ORDER — INSULIN REGULAR IN 0.9 % NACL 100 UNIT/100 ML (1 UNIT/ML) IV SOLN
1-32 [IU]/h | INTRAVENOUS | 0 refills | Status: DC
Start: 2018-04-08 — End: 2018-04-09
  Administered 2018-04-08: 17:00:00 0.5 [IU]/h via INTRAVENOUS
  Administered 2018-04-09: 06:00:00 2.5 [IU]/h via INTRAVENOUS
  Administered 2018-04-09: 04:00:00 1 [IU]/h via INTRAVENOUS
  Administered 2018-04-09: 05:00:00 2 [IU]/h via INTRAVENOUS

## 2018-04-08 MED ORDER — MAGNESIUM HYDROXIDE 2,400 MG/10 ML PO SUSP
10 mL | ORAL | 0 refills | Status: DC | PRN
Start: 2018-04-08 — End: 2018-04-12

## 2018-04-09 LAB — POC GLUCOSE
Lab: 165 mg/dL — ABNORMAL HIGH (ref 70–100)
Lab: 189 mg/dL — ABNORMAL HIGH (ref 70–100)
Lab: 155 mg/dL — ABNORMAL HIGH (ref 70–100)
Lab: 167 mg/dL — ABNORMAL HIGH (ref 70–100)
Lab: 156 mg/dL — ABNORMAL HIGH (ref 70–100)
Lab: 140 mg/dL — ABNORMAL HIGH (ref 70–100)
Lab: 117 mg/dL — ABNORMAL HIGH (ref 70–100)
Lab: 81 mg/dL (ref 70–100)
Lab: 53 mg/dL — ABNORMAL LOW (ref 70–100)
Lab: 56 mg/dL — ABNORMAL LOW (ref 70–100)
Lab: 63 mg/dL — ABNORMAL LOW (ref 70–100)
Lab: 62 mg/dL — ABNORMAL LOW (ref 70–100)
Lab: 70 mg/dL (ref 70–100)
Lab: 62 mg/dL — ABNORMAL LOW (ref 70–100)
Lab: 71 mg/dL (ref 70–100)
Lab: 60 mg/dL — ABNORMAL LOW (ref 70–100)
Lab: 169 mg/dL — ABNORMAL HIGH (ref 70–100)

## 2018-04-09 LAB — BASIC METABOLIC PANEL
Lab: 0.5 mg/dL (ref 0.4–1.00)
Lab: 11 mg/dL — ABNORMAL HIGH (ref 3–12)
Lab: 134 MMOL/L — ABNORMAL LOW (ref 137–147)
Lab: 150 mg/dL — ABNORMAL HIGH (ref 70–100)
Lab: 19 MMOL/L — ABNORMAL LOW (ref 21–30)
Lab: 8.5 mg/dL — ABNORMAL LOW (ref >60)
Lab: >60 mL/min (ref >60)
Lab: >60 mL/min — ABNORMAL LOW (ref >60)

## 2018-04-09 LAB — URINALYSIS DIPSTICK
Lab: NEGATIVE
Lab: NEGATIVE
Lab: NEGATIVE
Lab: NEGATIVE

## 2018-04-09 LAB — URINALYSIS, MICROSCOPIC

## 2018-04-09 LAB — THYROID STIMULATING HORMONE-TSH: Lab: 2.9 uU/mL (ref 0.35–5.00)

## 2018-04-09 LAB — CBC: Lab: 82 FL — ABNORMAL LOW (ref 80–100)

## 2018-04-09 MED ORDER — INSULIN ASPART 100 UNIT/ML SC FLEXPEN
1-25 [IU] | Freq: Three times a day (TID) | SUBCUTANEOUS | 0 refills | Status: DC
Start: 2018-04-09 — End: 2018-04-12
  Administered 2018-04-09: 20:00:00 6 [IU] via SUBCUTANEOUS

## 2018-04-09 MED ORDER — INSULIN ASPART 100 UNIT/ML SC FLEXPEN
0-12 [IU] | Freq: Before meals | SUBCUTANEOUS | 0 refills | Status: DC
Start: 2018-04-09 — End: 2018-04-12

## 2018-04-09 MED ORDER — INSULIN GLARGINE 100 UNIT/ML (3 ML) SC INJ PEN
32 [IU] | Freq: Every day | SUBCUTANEOUS | 0 refills | Status: DC
Start: 2018-04-09 — End: 2018-04-10
  Administered 2018-04-09: 17:00:00 32 [IU] via SUBCUTANEOUS

## 2018-04-09 MED ADMIN — SODIUM CHLORIDE 0.9 % IV SOLP [27838]: 1000 mL | INTRAVENOUS | @ 04:00:00 | Stop: 2018-04-09 | NDC 00338004904

## 2018-04-09 MED ADMIN — INSULIN REGULAR HUMAN(#) 1 UNIT/ML IJ SYRINGE [211626]: 2 [IU] | INTRAVENOUS | @ 03:00:00 | Stop: 2018-04-09 | NDC 54029038702

## 2018-04-09 NOTE — Consults
Endocrinology Note   Name:  Alexandra Donaldson MRN:  [id number] Admission Date:  04/07/2018    Active Problems:    Type 1 diabetes mellitus during pregnancy in third trimester    Hypothyroid in pregnancy, antepartum, third trimester    Pregnancy    Pre-eclampsia in third trimester      Reason for Consult:     t1dm, postpartum please manage    Assessment / Plan     Alexandra Donaldson is a 23 y.o. female with history of hypothyroidism, type 1 diabetes who was admitted to the hospital on 04/07/2018 for preeclampsia and is now postpartum.  Endocrinology consulted for diabetes management.    Type 1 Diabetes Mellitus, uncontrolled  Dx: Age 51  Last HgbA1C 7.8 on 02/26/2018  PTA regimen (before pregnancy): Basal glargine 45 units daily, aspart insulin to carb ratio 1: 8  (During pregnancy): NPH 30/40, aspart 26/24/26  Hypoglycemic episodes on this regimen: None on prepregnancy dose  Follows up with PCP for diabetes management  Diabetic-complications assessment:   Retinopathy: no.   Peripheral neuropathy: no   Autonomic neuropathy: no   Nephropathy: no   Macrovascular complications: no known CAD  Risk Factor assessment   Last lipid profile none on file  On ACEi/ARB?: no  On Statin?: no    Hypothyroidism  On levothyroxine replacement, did not require increased dose during pregnancy    Preeclampsia   s/p C-section 04/08/2018  GBS positive  Maternal bradycardia intraoperatively  Occurred during C-section 04/08/2018, cardiology consulted    Impression / Recommendations  The patient reports that she was on a dose of Basaglar 45 units daily with no hypoglycemia prior to pregnancy.  We will reduce her dose to be more consistent with a weight-based dose and less than half her basal dose during pregnancy now that she has postpartum.  This dose may still be a little too aggressive we will follow closely and reduce aggressively if needed.  If patient becomes persistently hyperglycemic she may need D5 for a time after receiving her glargine Start Glargine 32 daily  Start ICR 8 units TID [facility]  [facility]      Sandre Kitty, DO  Pager [phone]    Subjective:      Alexandra Donaldson is a 23 y.o. female with history of hypothyroidism, type 1 diabetes who was admitted to the hospital on 04/07/2018 for preeclampsia and is now postpartum.  Endocrinology consulted for diabetes management.    Patient managed during pregnancy by maternal-fetal medicine for diabetes was admitted to the hospital on 04/07/2018 for preeclampsia with swelling of upper and lower extremities and hypertension.  Decision was made to admit the patient and on 04/08/2018 patient underwent C-section for delivery.  Patient is now postpartum and has been on insulin drip overnight.    Patient diagnosed with diabetes type 1 at age 11 she follows with her PCP and has not seen endocrinologist for many years.  Patient reports that she was on Basaglar 45 units nightly as well as aspart insulin to carb ratio 1: 8 with meals prior to pregnancy.  The patient reports persistent hyperglycemia on this regimen with no hypoglycemic episodes.  Patient does plan to breast-feed postpartum    Patient denies family history of diabetes  Patient denies tobacco, alcohol, drug use    Medical History:   Diagnosis Date   ? Disorder of thyroid gland    ? DM (diabetes mellitus) (HCC)      Surgical History:   Procedure Laterality Date   ?  HX ADENOIDECTOMY     ? HX TONSILLECTOMY     ? WISDOM TEETH EXTRACTION       History reviewed. No pertinent family history.  Social History     Socioeconomic History   ? Marital status: Single     Spouse name: Not on file   ? Number of children: Not on file   ? Years of education: Not on file   ? Highest education level: Not on file   Occupational History   ? Not on file   Social Needs   ? Financial resource strain: Not on file   ? Food insecurity:     Worry: Not on file     Inability: Not on file   ? Transportation needs:     Medical: Not on file     Non-medical: Not on file   Tobacco Use ? Smoking status: Never Smoker   ? Smokeless tobacco: Never Used   Substance and Sexual Activity   ? Alcohol use: Not Currently   ? Drug use: Never   ? Sexual activity: Not on file   Lifestyle   ? Physical activity:     Days per week: Not on file     Minutes per session: Not on file   ? Stress: Not on file   Relationships   ? Social connections:     Talks on phone: Not on file     Gets together: Not on file     Attends religious service: Not on file     Active member of club or organization: Not on file     Attends meetings of clubs or organizations: Not on file     Relationship status: Not on file   ? Intimate partner violence:     Fear of current or ex partner: Not on file     Emotionally abused: Not on file     Physically abused: Not on file     Forced sexual activity: Not on file   Other Topics Concern   ? Not on file   Social History Narrative   ? Not on file      Immunizations (includes history and patient reported):   Immunization History   Administered Date(s) Administered   ? Tdap Vaccine 02/17/2018           Allergies:  Patient has no known allergies.    Review of Systems: fatigue  Constitutional: negative for fevers, chills  Eyes: negative for visual disturbance  Ears, nose, mouth, throat, and face: change in hearing  Respiratory: negative for cough or SOB  Cardiovascular: negative for chest pain, palpitations  Gastrointestinal: negative for nausea, vomiting, abdominal pain  Genitourinary:negative for hematuria  Integument/breast: negative for rash  Musculoskeletal:negative for myalgias and arthralgias  Neurological: negative for headaches, dizziness and lightheadedness  Endocrine: negative for heat or cold intolerance    Physical Exam:     General:  Alert, cooperative, no distress, appears stated age  Head:  Normocephalic  Eyes:  No scleral icterus  Neck:  symmetrical  Lungs:  CTA  Heart:    RRR  Abdomen:  S, NT, + BS  Skin:   No rashes  Neurologic:  Alert Vital Signs: Last Filed In 24 Hours Vital Signs: 24 Hour Range   BP: 119/67 (01/30 0600)  Temp: 37.2 ???C (99 ???F) (01/29 2216)  Pulse: 107 (01/30 0625)  Respirations: 22 PER MINUTE (01/29 2258)  SpO2: 99 % (01/30 0625)  SpO2 Pulse: 124 (01/30 0315) BP: (101-158)/(44-102)  Temp:  [36.9 ???C (98.4 ???F)-37.3 ???C (99.2 ???F)]   Pulse:  [90-139]   Respirations:  [14 PER MINUTE-24 PER MINUTE]   SpO2:  [96 %-100 %]    Intensity Pain Scale (Self Report): 3 (04/08/18 1802)        Medications:  No current facility-administered medications on file prior to encounter.      Current Outpatient Medications on File Prior to Encounter   Medication Sig Dispense Refill   ??? acetaminophen (TYLENOL) 500 mg tablet Take 1,000 mg by mouth daily as needed for Pain. Max of 4,000 mg of acetaminophen in 24 hours.      ??? blood sugar diagnostic test strip Use one strip as directed four times daily. Indications: MEDTRONIC CONTOUR NEXT LINK 2.4 TEST STRIPS 120 strip 3   ??? insulin aspart U-100 (NOVOLOG) 100 unit/mL injection For insulin pump (Patient taking differently: Breakfast 26, Lunch 24, Dinner 26  Indications: type 1 diabetes mellitus) 50 mL 4   ??? insulin pen needles (disposable) (BD UF NANO PEN NEEDLES) 32 gauge x 5/32 pen needle Use one each as directed as Needed. Use with insulin injections. 300 each 3   ??? Insulin Syringe-Needle U-100 (BD INSULIN SYRINGE ULTRA-FINE) 0.5 mL 31 gauge x 5/16 syrg Use one new syringe with each injection of insulin (two injections daily) 100 each 3   ??? levothyroxine (SYNTHROID) 50 mcg tablet Take one tablet by mouth daily 30 minutes before breakfast. 90 tablet 3   ??? [id number]-dha-epa-fish oil 400 mcg-35 mg -25 mg-5 mg Chew 2 Gummy by mouth daily.         Lab/Radiology/Other Diagnostic Tests:  24-hour labs:    Results for orders placed or performed during the hospital encounter of 04/07/18 (from the past 24 hour(s))   POC GLUCOSE    Collection Time: 04/08/18  1:02 PM   Result Value Ref Range Glucose, POC 109 (H) 70 - 100 MG/DL   POC GLUCOSE    Collection Time: 04/08/18  2:45 PM   Result Value Ref Range    Glucose, POC 98 70 - 100 MG/DL   BLOOD GASES-CORD BLOOD    Collection Time: 04/08/18  3:20 PM   Result Value Ref Range    pH-Cord Blood 7.14 (LL)     pCO2-Cord Blood 74 MMHG    pO2-Cord Blood 11 MMHG    Base Deficit-Cord Blood 6.8 MMOL/L    O2 Sat-Cord Blood 9.7 %    Cord Blood Gas Source ART    BLOOD GASES-CORD BLOOD    Collection Time: 04/08/18  3:20 PM   Result Value Ref Range    pH-Cord Blood 7.21     pCO2-Cord Blood 60 MMHG    pO2-Cord Blood 13 MMHG    Base Deficit-Cord Blood 5.6 MMOL/L    O2 Sat-Cord Blood 12.5 %    Cord Blood Gas Source VEN    POC GLUCOSE    Collection Time: 04/08/18  5:22 PM   Result Value Ref Range    Glucose, POC 128 (H) 70 - 100 MG/DL   BASIC METABOLIC PANEL    Collection Time: 04/08/18  6:07 PM   Result Value Ref Range    Sodium 134 (L) 137 - 147 MMOL/L    Potassium 3.9 3.5 - 5.1 MMOL/L    Chloride 104 98 - 110 MMOL/L    CO2 19 (L) 21 - 30 MMOL/L    Anion Gap 11 3 - 12    Glucose 150 (H) 70 - 100 MG/DL  Blood Urea Nitrogen 5 (L) 7 - 25 MG/DL    Creatinine 9.14 0.4 - 1.00 MG/DL    Calcium 8.5 8.5 - 78.2 MG/DL    eGFR Non African American >60 >60 mL/min    eGFR Non African American       The eGFR is not validated for use in drug dosing adjustments.  Continue to use   estimated creatinine clearance per dosing reference text.  Please contact the   Clinical Pharmacist for questions.      eGFR African American >60 >60 mL/min    eGFR African American       The eGFR is not validated for use in drug dosing adjustments.  Continue to use   estimated creatinine clearance per dosing reference text.  Please contact the   Clinical Pharmacist for questions.     POC GLUCOSE    Collection Time: 04/08/18  7:33 PM   Result Value Ref Range    Glucose, POC 165 (H) 70 - 100 MG/DL   URINALYSIS DIPSTICK    Collection Time: 04/08/18  7:53 PM   Result Value Ref Range    Color,UA YELLOW Turbidity,UA CLEAR CLEAR    Specific Gravity-Urine 1.020 1.003 - 1.035    pH,UA 5.0 5.0 - 8.0    Protein,UA 2+ (A) NEG    Glucose,UA 1+ (A) NEG    Ketones,UA 2+ (A) NEG    Bilirubin,UA NEG NEG    Blood,UA 3+ (A) NEG    Urobilinogen,UA NORMAL NORMAL    Nitrite,UA NEG NEG    Leukocytes,UA NEG NEG    Urine Ascorbic Acid, UA NEG NEG   URINALYSIS, MICROSCOPIC    Collection Time: 04/08/18  7:53 PM   Result Value Ref Range    WBCs,UA 10-20 0 - 2 /HPF    RBCs,UA PACKED 0 - 3 /HPF    MucousUA TRACE     Squamous Epithelial Cells 0-2 0 - 5    Hyaline Cast 10-20    POC GLUCOSE    Collection Time: 04/08/18  8:35 PM   Result Value Ref Range    Glucose, POC 189 (H) 70 - 100 MG/DL   POC GLUCOSE    Collection Time: 04/08/18  9:36 PM   Result Value Ref Range    Glucose, POC 155 (H) 70 - 100 MG/DL   POC GLUCOSE    Collection Time: 04/08/18 10:45 PM   Result Value Ref Range    Glucose, POC 167 (H) 70 - 100 MG/DL   POC GLUCOSE    Collection Time: 04/08/18 11:39 PM   Result Value Ref Range    Glucose, POC 156 (H) 70 - 100 MG/DL   POC GLUCOSE    Collection Time: 04/09/18 12:36 AM   Result Value Ref Range    Glucose, POC 140 (H) 70 - 100 MG/DL   POC GLUCOSE    Collection Time: 04/09/18  1:34 AM   Result Value Ref Range    Glucose, POC 117 (H) 70 - 100 MG/DL   POC GLUCOSE    Collection Time: 04/09/18  2:38 AM   Result Value Ref Range    Glucose, POC 81 70 - 100 MG/DL   POC GLUCOSE    Collection Time: 04/09/18  3:31 AM   Result Value Ref Range    Glucose, POC 56 (L) 70 - 100 MG/DL   POC GLUCOSE    Collection Time: 04/09/18  3:49 AM   Result Value Ref Range    Glucose, POC 53 (L)  70 - 100 MG/DL   CBC    Collection Time: 04/09/18  3:51 AM   Result Value Ref Range    White Blood Cells 18.1 (H) 4.5 - 11.0 K/UL    RBC 3.46 (L) 4.0 - 5.0 M/UL    Hemoglobin 9.6 (L) 12.0 - 15.0 GM/DL    Hematocrit 19.1 (L) 36 - 45 %    MCV 82.3 80 - 100 FL    MCH 27.9 26 - 34 PG    MCHC 33.9 32.0 - 36.0 G/DL    RDW 47.8 11 - 15 % Platelet Count 203 150 - 400 K/UL    MPV 8.6 7 - 11 FL   POC GLUCOSE    Collection Time: 04/09/18  4:02 AM   Result Value Ref Range    Glucose, POC 63 (L) 70 - 100 MG/DL   POC GLUCOSE    Collection Time: 04/09/18  5:06 AM   Result Value Ref Range    Glucose, POC 62 (L) 70 - 100 MG/DL   POC GLUCOSE    Collection Time: 04/09/18  5:57 AM   Result Value Ref Range    Glucose, POC 70 70 - 100 MG/DL   POC GLUCOSE    Collection Time: 04/09/18  6:51 AM   Result Value Ref Range    Glucose, POC 62 (L) 70 - 100 MG/DL   POC GLUCOSE    Collection Time: 04/09/18  8:03 AM   Result Value Ref Range    Glucose, POC 71 70 - 100 MG/DL     FSBS (Manual): (!) 53 (04/09/18 0350)  Glucose: (!) 150 (04/08/18 1807)  POC Glucose (Download): 70 (04/09/18 0557)  Pertinent radiology reviewed.

## 2018-04-10 ENCOUNTER — Encounter: Admit: 2018-04-10 | Discharge: 2018-04-10

## 2018-04-10 DIAGNOSIS — E119 Type 2 diabetes mellitus without complications: Secondary | ICD-10-CM

## 2018-04-10 DIAGNOSIS — E079 Disorder of thyroid, unspecified: Secondary | ICD-10-CM

## 2018-04-10 LAB — POC GLUCOSE
Lab: 75 mg/dL (ref 70–100)
Lab: 124 mg/dL — ABNORMAL HIGH (ref 70–100)
Lab: 59 mg/dL — ABNORMAL LOW (ref 70–100)
Lab: 85 mg/dL (ref 70–100)
Lab: 125 mg/dL — ABNORMAL HIGH (ref 70–100)

## 2018-04-10 LAB — CULTURE-STREP SCREEN

## 2018-04-10 MED ORDER — INSULIN GLARGINE 100 UNIT/ML (3 ML) SC INJ PEN
32 [IU] | Freq: Every day | SUBCUTANEOUS | 0 refills | Status: DC
Start: 2018-04-10 — End: 2018-04-11

## 2018-04-10 MED ORDER — INSULIN GLARGINE 100 UNIT/ML (3 ML) SC INJ PEN
28 [IU] | Freq: Every day | SUBCUTANEOUS | 0 refills | Status: DC
Start: 2018-04-10 — End: 2018-04-10

## 2018-04-10 MED ORDER — INSULIN GLARGINE 100 UNIT/ML (3 ML) SC INJ PEN
24 [IU] | Freq: Every day | SUBCUTANEOUS | 0 refills | Status: DC
Start: 2018-04-10 — End: 2018-04-10

## 2018-04-11 ENCOUNTER — Encounter: Admit: 2018-04-11 | Discharge: 2018-04-11

## 2018-04-11 LAB — POC GLUCOSE
Lab: 73 mg/dL (ref >60)
Lab: 220 mg/dL — ABNORMAL HIGH (ref 70–100)

## 2018-04-11 MED ORDER — OXYCODONE 5 MG PO TAB
5-10 mg | ORAL_TABLET | ORAL | 0 refills | 6.00000 days | Status: AC | PRN
Start: 2018-04-11 — End: ?
  Filled 2018-04-11 (×2): qty 20, 2d supply, fill #1

## 2018-04-11 MED ORDER — IBUPROFEN 600 MG PO TAB
600 mg | ORAL_TABLET | ORAL | 1 refills | Status: AC
Start: 2018-04-11 — End: ?
  Filled 2018-04-11 (×2): qty 30, 8d supply, fill #1

## 2018-04-11 MED ORDER — ACETAMINOPHEN 325 MG PO TAB
650 mg | ORAL_TABLET | ORAL | 1 refills | Status: AC
Start: 2018-04-11 — End: ?

## 2018-04-11 MED ORDER — INSULIN GLARGINE 100 UNIT/ML (3 ML) SC INJ PEN
32 [IU] | Freq: Every morning | SUBCUTANEOUS | 0 refills | 60.00000 days | Status: AC
Start: 2018-04-11 — End: ?
  Filled 2018-04-11 (×4): qty 45, 30d supply, fill #1

## 2018-04-11 MED ORDER — INSULIN ASPART 100 UNIT/ML SC FLEXPEN
8 [IU] | Freq: Three times a day (TID) | SUBCUTANEOUS | 0 refills | 42.00000 days | Status: AC
Start: 2018-04-11 — End: ?

## 2018-04-11 MED ORDER — LIDOCAINE (PF) 10 MG/ML (1 %) IJ SOLN
2 mL | Freq: Once | INTRAMUSCULAR | 0 refills | Status: DC
Start: 2018-04-11 — End: 2018-04-12

## 2018-04-11 MED ORDER — LEVOTHYROXINE 50 MCG PO TAB
50 ug | ORAL_TABLET | Freq: Every day | ORAL | 0 refills | 30.00000 days | Status: AC
Start: 2018-04-11 — End: ?
  Filled 2018-04-20 (×2): qty 90, 30d supply, fill #1

## 2018-04-11 MED ORDER — INSULIN GLARGINE 100 UNIT/ML (3 ML) SC INJ PEN
26 [IU] | Freq: Every evening | SUBCUTANEOUS | 0 refills | Status: DC
Start: 2018-04-11 — End: 2018-04-12

## 2018-04-11 MED ORDER — INSULIN GLARGINE 100 UNIT/ML (3 ML) SC INJ PEN
26 [IU] | Freq: Every day | SUBCUTANEOUS | 0 refills | Status: DC
Start: 2018-04-11 — End: 2018-04-11

## 2018-04-11 MED ORDER — DOCUSATE SODIUM 100 MG PO CAP
100 mg | ORAL_CAPSULE | Freq: Two times a day (BID) | ORAL | 1 refills | Status: AC
Start: 2018-04-11 — End: ?

## 2018-04-11 MED ORDER — ETONOGESTREL 68 MG SDRM IMPL
68 mg | Freq: Once | SUBCUTANEOUS | 0 refills | Status: CP
Start: 2018-04-11 — End: ?
  Administered 2018-04-11: 22:00:00 68 mg via SUBCUTANEOUS

## 2018-04-11 MED ORDER — LANOLIN TP CREA
TOPICAL | 1 refills | Status: AC | PRN
Start: 2018-04-11 — End: ?

## 2018-04-11 MED ADMIN — LIDOCAINE HCL 10 MG/ML (1 %) IJ SOLN [4452]: 20 mL | INTRAMUSCULAR | @ 22:00:00 | Stop: 2018-04-11 | NDC 00409427616

## 2018-04-12 ENCOUNTER — Encounter: Admit: 2018-04-07 | Discharge: 2018-04-07

## 2018-04-12 ENCOUNTER — Inpatient Hospital Stay: Admit: 2018-04-08 | Discharge: 2018-04-08

## 2018-04-12 ENCOUNTER — Inpatient Hospital Stay: Admit: 2018-04-10 | Discharge: 2018-04-11

## 2018-04-12 ENCOUNTER — Inpatient Hospital Stay: Admit: 2018-04-07 | Discharge: 2018-04-07

## 2018-04-12 ENCOUNTER — Ambulatory Visit: Admit: 2018-04-07 | Discharge: 2018-04-12 | Disposition: A | Discharge status: Home / Self Care | Payer: 59

## 2018-04-12 DIAGNOSIS — Z794 Long term (current) use of insulin: Secondary | ICD-10-CM

## 2018-04-12 DIAGNOSIS — O99284 Endocrine, nutritional and metabolic diseases complicating childbirth: Secondary | ICD-10-CM

## 2018-04-12 DIAGNOSIS — E10649 Type 1 diabetes mellitus with hypoglycemia without coma: Secondary | ICD-10-CM

## 2018-04-12 DIAGNOSIS — Z9641 Presence of insulin pump (external) (internal): Secondary | ICD-10-CM

## 2018-04-12 DIAGNOSIS — Z30017 Encounter for initial prescription of implantable subdermal contraceptive: Secondary | ICD-10-CM

## 2018-04-12 DIAGNOSIS — O99824 Streptococcus B carrier state complicating childbirth: Secondary | ICD-10-CM

## 2018-04-12 DIAGNOSIS — Z3A36 36 weeks gestation of pregnancy: Secondary | ICD-10-CM

## 2018-04-12 DIAGNOSIS — E039 Hypothyroidism, unspecified: Secondary | ICD-10-CM

## 2018-04-12 DIAGNOSIS — O1414 Severe pre-eclampsia complicating childbirth: Secondary | ICD-10-CM

## 2018-04-12 DIAGNOSIS — I469 Cardiac arrest, cause unspecified: Secondary | ICD-10-CM

## 2018-04-12 DIAGNOSIS — O2402 Pre-existing diabetes mellitus, type 1, in childbirth: Secondary | ICD-10-CM

## 2018-04-12 DIAGNOSIS — Q261 Persistent left superior vena cava: Secondary | ICD-10-CM

## 2018-04-13 ENCOUNTER — Encounter: Admit: 2018-04-13 | Discharge: 2018-04-13

## 2018-04-14 ENCOUNTER — Encounter: Admit: 2018-04-14 | Discharge: 2018-04-14

## 2018-04-16 ENCOUNTER — Encounter: Admit: 2018-04-16 | Discharge: 2018-04-16

## 2018-04-17 ENCOUNTER — Ambulatory Visit: Admit: 2018-04-17 | Discharge: 2018-04-18 | Payer: 59

## 2018-04-17 ENCOUNTER — Emergency Department: Admit: 2018-04-17 | Discharge: 2018-04-17 | Disposition: A | Discharge status: Home / Self Care | Payer: 59

## 2018-04-17 ENCOUNTER — Encounter: Admit: 2018-04-17 | Discharge: 2018-04-17

## 2018-04-17 ENCOUNTER — Emergency Department: Admit: 2018-04-17 | Discharge: 2018-04-17

## 2018-04-17 ENCOUNTER — Encounter: Admit: 2018-04-17 | Discharge: 2018-04-18

## 2018-04-17 DIAGNOSIS — R0602 Shortness of breath: ICD-10-CM

## 2018-04-17 DIAGNOSIS — E119 Type 2 diabetes mellitus without complications: Principal | ICD-10-CM

## 2018-04-17 DIAGNOSIS — R739 Hyperglycemia, unspecified: ICD-10-CM

## 2018-04-17 DIAGNOSIS — E1065 Type 1 diabetes mellitus with hyperglycemia: Principal | ICD-10-CM

## 2018-04-17 DIAGNOSIS — D72829 Elevated white blood cell count, unspecified: ICD-10-CM

## 2018-04-17 DIAGNOSIS — E079 Disorder of thyroid, unspecified: ICD-10-CM

## 2018-04-17 LAB — COMPREHENSIVE METABOLIC PANEL
Lab: 0.3 mg/dL — ABNORMAL LOW (ref 0.3–1.2)
Lab: 0.6 mg/dL (ref 0.4–1.00)
Lab: 11 U/L (ref 7–56)
Lab: 12 K/UL — ABNORMAL HIGH (ref 3–12)
Lab: 129 U/L — ABNORMAL HIGH (ref 25–110)
Lab: 135 MMOL/L — ABNORMAL LOW (ref 137–147)
Lab: 15 U/L (ref 7–40)
Lab: 23 MMOL/L (ref 21–30)
Lab: 367 mg/dL — ABNORMAL HIGH (ref 70–100)
Lab: 9.2 mg/dL (ref 8.5–10.6)
Lab: >60 mL/min — ABNORMAL HIGH (ref >60)

## 2018-04-17 LAB — URINALYSIS, MICROSCOPIC

## 2018-04-17 LAB — URINALYSIS DIPSTICK
Lab: NEGATIVE
Lab: NEGATIVE
Lab: NEGATIVE
Lab: NEGATIVE

## 2018-04-17 LAB — CBC AND DIFF
Lab: 0.1 10*3/uL (ref 0–0.20)
Lab: 21 10*3/uL — ABNORMAL HIGH (ref 4.5–11.0)
Lab: 26 pg (ref 26–34)
Lab: 559 K/UL — ABNORMAL HIGH (ref 150–400)
Lab: 79 % — ABNORMAL HIGH (ref 41–77)

## 2018-04-17 LAB — PROTEIN/CR RATIO,UR RAN: Lab: 55 mg/dL (ref 0–3)

## 2018-04-17 LAB — LDH-LACTATE DEHYDROGENASE: Lab: 371 U/L — ABNORMAL HIGH (ref 100–210)

## 2018-04-17 LAB — BNP POC ER: Lab: <15 pg/mL (ref 0–100)

## 2018-04-17 LAB — URIC ACID: Lab: 5.4 mg/dL (ref 2.0–7.0)

## 2018-04-17 LAB — PROTIME INR (PT): Lab: 1.1 MMOL/L — ABNORMAL LOW (ref 0.8–1.2)

## 2018-04-17 LAB — TSH WITH FREE T4 REFLEX: Lab: 1.6 uU/mL (ref 0.35–5.00)

## 2018-04-17 LAB — D-DIMER: Lab: 372 ng{FEU}/mL — ABNORMAL HIGH (ref <500)

## 2018-04-17 LAB — POC TROPONIN: Lab: 0 ng/mL (ref 0.00–0.05)

## 2018-04-17 LAB — POC GLUCOSE: Lab: 318 mg/dL — ABNORMAL HIGH (ref 70–100)

## 2018-04-17 LAB — PTT (APTT): Lab: 29 s — ABNORMAL LOW (ref 24.0–36.5)

## 2018-04-17 MED ORDER — SODIUM CHLORIDE 0.9 % IJ SOLN
50 mL | Freq: Once | INTRAVENOUS | 0 refills | Status: CP
Start: 2018-04-17 — End: ?
  Administered 2018-04-17: 22:00:00 50 mL via INTRAVENOUS

## 2018-04-17 MED ORDER — IOHEXOL 350 MG IODINE/ML IV SOLN
60 mL | Freq: Once | INTRAVENOUS | 0 refills | Status: CP
Start: 2018-04-17 — End: ?
  Administered 2018-04-17: 22:00:00 60 mL via INTRAVENOUS

## 2018-04-17 NOTE — ED Notes
ED discharge instructions given, all questions and concerns addressed. Patient verbalizes understanding. Able to ambulate out of ED without assistance.

## 2018-04-17 NOTE — Progress Notes
Patient care was assumed from Dr. Hyacinth Meeker with plans to follow up on the CTA chest as well as the UA. The UA showed no evidence of infection. The CTA chest showed no evidence of a PE.  Findings suspicious for possible sequelae of granulomatous infection such as histoplasmosis.  It also noted concerns for possible thrombosed AV malformation which would have to be evaluated with a bubble study.  Per chart review, the patient had an echo with bubble study performed approximately 2 months prior to presentation, and it made no mention of a shunt at this time.  Radiology was spoken to over the phone, and they noted that this finding would have been present at the time of that study, so she does not need an additional echo with bubble study.    The patient was spoken to about these results, and she was advised that she needs a follow-up CT scan in 3 months to assess for stability.  This was also written in her discharge instructions.  The patient was given follow-up instructions, return precautions, all questions were answered, and the patient was discharged home.    Erick Alley, MD

## 2018-04-20 ENCOUNTER — Encounter: Admit: 2018-04-20 | Discharge: 2018-04-20

## 2018-04-22 ENCOUNTER — Encounter: Admit: 2018-04-22 | Discharge: 2018-04-22

## 2018-05-07 ENCOUNTER — Encounter: Admit: 2018-05-07 | Discharge: 2018-05-07

## 2018-05-27 NOTE — Progress Notes
Alexandra Donaldson is a 23 y.o.. [id number]    She presents 6 weeks postpartum following a primary Cesarean section at [redacted]w[redacted]d due to pre-eclampsia with severe features and Cat 2 FHR tracing .       Visit diagnoses  No diagnosis found.    Plan:       -Contraception - s/p Nexplanon  -Pap - collected today  -Type 1 Diabetes managed by endocrine  - Pre E with severe features, BP WNL today  -Discussed symptoms of postpartum depression, birth spacing and reproductive plans, and  implications of c-section on future pregnancies and deliveries.   -Discussed weight loss.   -No immunizations today. Patient received TDap  -CTA while in ED resulted with pulmonary nodules- results provided to patient who will follow up with PCP- repeat CT is recommended.   -Pt sees Rufina Falco in [place] for primary care, will follow up for care PRN or as clinically indicated.        Jettie Pagan, MD   PGY-4 Obsterics and Gynecology

## 2018-05-28 ENCOUNTER — Encounter: Admit: 2018-05-28 | Discharge: 2018-05-28

## 2018-05-28 ENCOUNTER — Ambulatory Visit: Admit: 2018-05-28 | Discharge: 2018-05-29 | Payer: 59

## 2018-05-28 DIAGNOSIS — E079 Disorder of thyroid, unspecified: ICD-10-CM

## 2018-05-28 DIAGNOSIS — E119 Type 2 diabetes mellitus without complications: Principal | ICD-10-CM

## 2018-05-29 DIAGNOSIS — Z124 Encounter for screening for malignant neoplasm of cervix: ICD-10-CM

## 2019-08-27 ENCOUNTER — Encounter: Admit: 2019-08-27 | Discharge: 2019-08-27

## 2019-08-27 NOTE — Progress Notes
CTA Chest dated 04/17/2018 sent to patient's PCP on file on 06/04/2019. Phone calls placed to PCP office regarding F/U recommendations placed on 06/30/2019 & 08/20/2019.

## 2022-01-12 ENCOUNTER — Encounter: Admit: 2022-01-12 | Discharge: 2022-01-12
